# Patient Record
Sex: Male | Born: 1940 | Race: White | Hispanic: No | Marital: Married | Smoking: Former smoker
Health system: Southern US, Community
[De-identification: ages and names within clinical notes are randomized; demographics above are authoritative.]

## PROBLEM LIST (undated history)

## (undated) DIAGNOSIS — F419 Anxiety disorder, unspecified: Secondary | ICD-10-CM

## (undated) DIAGNOSIS — I10 Essential (primary) hypertension: Secondary | ICD-10-CM

## (undated) DIAGNOSIS — M1990 Unspecified osteoarthritis, unspecified site: Secondary | ICD-10-CM

## (undated) DIAGNOSIS — G4730 Sleep apnea, unspecified: Secondary | ICD-10-CM

## (undated) DIAGNOSIS — Z974 Presence of external hearing-aid: Secondary | ICD-10-CM

## (undated) DIAGNOSIS — K219 Gastro-esophageal reflux disease without esophagitis: Secondary | ICD-10-CM

## (undated) DIAGNOSIS — E785 Hyperlipidemia, unspecified: Secondary | ICD-10-CM

## (undated) DIAGNOSIS — Z87442 Personal history of urinary calculi: Secondary | ICD-10-CM

## (undated) HISTORY — PX: TONSILLECTOMY: SUR1361

## (undated) HISTORY — PX: EYE SURGERY: SHX253

## (undated) HISTORY — PX: COLONOSCOPY: SHX174

---

## 2007-04-13 HISTORY — PX: JOINT REPLACEMENT: SHX530

## 2017-10-11 ENCOUNTER — Encounter: Payer: Self-pay | Admitting: *Deleted

## 2017-10-14 ENCOUNTER — Ambulatory Visit
Admission: RE | Admit: 2017-10-14 | Discharge: 2017-10-14 | Disposition: A | Discharge status: Home / Self Care | Payer: Medicare Other | Source: Ambulatory Visit | Attending: Unknown Physician Specialty | Admitting: Unknown Physician Specialty

## 2017-10-14 ENCOUNTER — Encounter: Payer: Self-pay | Admitting: *Deleted

## 2017-10-14 ENCOUNTER — Ambulatory Visit: Payer: Medicare Other | Admitting: Anesthesiology

## 2017-10-14 ENCOUNTER — Encounter
Admission: RE | Disposition: A | Discharge status: Home / Self Care | Payer: Self-pay | Source: Ambulatory Visit | Attending: Unknown Physician Specialty

## 2017-10-14 DIAGNOSIS — D122 Benign neoplasm of ascending colon: Secondary | ICD-10-CM | POA: Insufficient documentation

## 2017-10-14 DIAGNOSIS — F419 Anxiety disorder, unspecified: Secondary | ICD-10-CM | POA: Insufficient documentation

## 2017-10-14 DIAGNOSIS — K640 First degree hemorrhoids: Secondary | ICD-10-CM | POA: Diagnosis not present

## 2017-10-14 DIAGNOSIS — Z1211 Encounter for screening for malignant neoplasm of colon: Secondary | ICD-10-CM | POA: Diagnosis present

## 2017-10-14 DIAGNOSIS — E785 Hyperlipidemia, unspecified: Secondary | ICD-10-CM | POA: Insufficient documentation

## 2017-10-14 DIAGNOSIS — I10 Essential (primary) hypertension: Secondary | ICD-10-CM | POA: Insufficient documentation

## 2017-10-14 DIAGNOSIS — G4730 Sleep apnea, unspecified: Secondary | ICD-10-CM | POA: Insufficient documentation

## 2017-10-14 DIAGNOSIS — D123 Benign neoplasm of transverse colon: Secondary | ICD-10-CM | POA: Insufficient documentation

## 2017-10-14 DIAGNOSIS — Z87442 Personal history of urinary calculi: Secondary | ICD-10-CM | POA: Diagnosis not present

## 2017-10-14 DIAGNOSIS — Z87891 Personal history of nicotine dependence: Secondary | ICD-10-CM | POA: Insufficient documentation

## 2017-10-14 DIAGNOSIS — Z7982 Long term (current) use of aspirin: Secondary | ICD-10-CM | POA: Insufficient documentation

## 2017-10-14 DIAGNOSIS — Z79899 Other long term (current) drug therapy: Secondary | ICD-10-CM | POA: Insufficient documentation

## 2017-10-14 DIAGNOSIS — K635 Polyp of colon: Secondary | ICD-10-CM | POA: Diagnosis not present

## 2017-10-14 HISTORY — DX: Hyperlipidemia, unspecified: E78.5

## 2017-10-14 HISTORY — PX: COLONOSCOPY WITH PROPOFOL: SHX5780

## 2017-10-14 HISTORY — DX: Anxiety disorder, unspecified: F41.9

## 2017-10-14 HISTORY — DX: Personal history of urinary calculi: Z87.442

## 2017-10-14 HISTORY — DX: Sleep apnea, unspecified: G47.30

## 2017-10-14 HISTORY — DX: Essential (primary) hypertension: I10

## 2017-10-14 SURGERY — COLONOSCOPY WITH PROPOFOL
Anesthesia: General

## 2017-10-14 MED ORDER — MIDAZOLAM HCL 2 MG/2ML IJ SOLN
INTRAMUSCULAR | Status: AC
  Filled 2017-10-14: qty 2

## 2017-10-14 MED ORDER — MIDAZOLAM HCL 5 MG/5ML IJ SOLN
INTRAMUSCULAR | Status: DC | PRN
  Administered 2017-10-14: 1 mg via INTRAVENOUS

## 2017-10-14 MED ORDER — SODIUM CHLORIDE 0.9 % IV SOLN: INTRAVENOUS | Status: DC

## 2017-10-14 MED ORDER — PROPOFOL 10 MG/ML IV BOLUS
INTRAVENOUS | Status: DC | PRN
  Administered 2017-10-14: 30 mg via INTRAVENOUS
  Administered 2017-10-14: 10 mg via INTRAVENOUS

## 2017-10-14 MED ORDER — PIPERACILLIN-TAZOBACTAM 3.375 G IVPB
INTRAVENOUS | Status: AC
  Filled 2017-10-14: qty 50

## 2017-10-14 MED ORDER — SODIUM CHLORIDE 0.9 % IV SOLN
INTRAVENOUS | Status: DC
  Administered 2017-10-14: 1000 mL via INTRAVENOUS

## 2017-10-14 MED ORDER — PROPOFOL 500 MG/50ML IV EMUL
INTRAVENOUS | Status: DC | PRN
  Administered 2017-10-14: 50 ug/kg/min via INTRAVENOUS

## 2017-10-14 MED ORDER — PIPERACILLIN-TAZOBACTAM 3.375 G IVPB 30 MIN
3.3750 g | Freq: Once | INTRAVENOUS | Status: AC
  Administered 2017-10-14: 3.375 g via INTRAVENOUS
  Filled 2017-10-14: qty 50

## 2017-10-14 MED ORDER — LIDOCAINE HCL (PF) 2 % IJ SOLN
INTRAMUSCULAR | Status: AC
  Filled 2017-10-14: qty 10

## 2017-10-14 MED ORDER — LIDOCAINE HCL (PF) 2 % IJ SOLN
INTRAMUSCULAR | Status: DC | PRN
  Administered 2017-10-14: 80 mg

## 2017-10-14 MED ORDER — FENTANYL CITRATE (PF) 100 MCG/2ML IJ SOLN
INTRAMUSCULAR | Status: DC | PRN
  Administered 2017-10-14 (×2): 25 ug via INTRAVENOUS

## 2017-10-14 MED ORDER — FENTANYL CITRATE (PF) 100 MCG/2ML IJ SOLN
INTRAMUSCULAR | Status: AC
  Filled 2017-10-14: qty 2

## 2017-10-14 NOTE — Anesthesia Post-op Follow-up Note (Signed)
Anesthesia QCDR form completed.        

## 2017-10-14 NOTE — Anesthesia Preprocedure Evaluation (Signed)
Anesthesia Evaluation  Patient identified by MRN, date of birth, ID band Patient awake    Reviewed: Allergy & Precautions, H&P , NPO status , Patient's Chart, lab work & pertinent test results, reviewed documented beta blocker date and time   Airway Mallampati: II   Neck ROM: full    Dental  (+) Poor Dentition   Pulmonary neg pulmonary ROS, sleep apnea and Continuous Positive Airway Pressure Ventilation , former smoker,    Pulmonary exam normal        Cardiovascular Exercise Tolerance: Good hypertension, On Medications negative cardio ROS Normal cardiovascular exam Rhythm:regular Rate:Normal     Neuro/Psych Anxiety negative neurological ROS  negative psych ROS   GI/Hepatic negative GI ROS, Neg liver ROS,   Endo/Other  negative endocrine ROS  Renal/GU negative Renal ROS  negative genitourinary   Musculoskeletal   Abdominal   Peds  Hematology negative hematology ROS (+)   Anesthesia Other Findings Past Medical History: No date: Anxiety No date: History of kidney stones No date: Hyperlipidemia No date: Hypertension No date: Sleep apnea Past Surgical History: No date: COLONOSCOPY 04/2007: JOINT REPLACEMENT; Right     Comment:  KNEE No date: TONSILLECTOMY BMI    Body Mass Index:  29.42 kg/m     Reproductive/Obstetrics negative OB ROS                             Anesthesia Physical Anesthesia Plan  ASA: III  Anesthesia Plan: General   Post-op Pain Management:    Induction:   PONV Risk Score and Plan:   Airway Management Planned:   Additional Equipment:   Intra-op Plan:   Post-operative Plan:   Informed Consent: I have reviewed the patients History and Physical, chart, labs and discussed the procedure including the risks, benefits and alternatives for the proposed anesthesia with the patient or authorized representative who has indicated his/her understanding and  acceptance.   Dental Advisory Given  Plan Discussed with: CRNA  Anesthesia Plan Comments:         Anesthesia Quick Evaluation

## 2017-10-14 NOTE — Transfer of Care (Signed)
Immediate Anesthesia Transfer of Care Note  Patient: Billy Berg  Procedure(s) Performed: COLONOSCOPY WITH PROPOFOL (N/A )  Patient Location: PACU  Anesthesia Type:General  Level of Consciousness: sedated  Airway & Oxygen Therapy: Patient Spontanous Breathing and Patient connected to nasal cannula oxygen  Post-op Assessment: Report given to RN and Post -op Vital signs reviewed and stable  Post vital signs: Reviewed and stable  Last Vitals:  Vitals Value Taken Time  BP    Temp    Pulse    Resp    SpO2      Last Pain:  Vitals:   10/14/17 1049  TempSrc: Tympanic  PainSc: 0-No pain         Complications: No apparent anesthesia complications

## 2017-10-14 NOTE — Op Note (Signed)
[facility] Patient Name: Billy Berg Procedure Date: 10/14/2017 11:38 AM MRN: [id number] Account #: [id number] Date of Birth: [birth year] Admit Type: Outpatient Age: 77 Room: [facility] ENDO ROOM 3 Gender: Male Note Status: Finalized Procedure:            Colonoscopy Indications:          Screening for colorectal malignant neoplasm Providers:            Manya Silvas, MD Referring MD:         Ramonita Lab, MD (Referring MD) Medicines:            Propofol per Anesthesia Complications:        No immediate complications. Procedure:            Pre-Anesthesia Assessment:                       - After reviewing the risks and benefits, the patient                        was deemed in satisfactory condition to undergo the                        procedure.                       After obtaining informed consent, the colonoscope was                        passed under direct vision. Throughout the procedure,                        the patient's blood pressure, pulse, and oxygen                        saturations were monitored continuously. The                        Colonoscope was introduced through the anus and                        advanced to the the cecum, identified by appendiceal                        orifice and ileocecal valve. The colonoscopy was                        performed without difficulty. The patient tolerated the                        procedure well. The quality of the bowel preparation                        was excellent. Findings:      A small polyp was found in the ascending colon. The polyp was sessile.       The polyp was removed with a hot snare. Resection and retrieval were       complete. To prevent bleeding after the polypectomy, one hemostatic clip       was successfully placed. There was no bleeding during, or at the end, of       the procedure.  A diminutive polyp was found in the ascending colon. The polyp was   sessile. The polyp was removed with a jumbo cold forceps. Resection and       retrieval were complete.      A diminutive polyp was found in the splenic flexure. The polyp was       sessile. The polyp was removed with a cold snare. Resection and       retrieval were complete.      Two sessile polyps were found in the sigmoid colon. The polyps were       diminutive in size. These polyps were removed with a jumbo cold forceps.       Resection and retrieval were complete.      External hemorrhoids were found during endoscopy. The hemorrhoids were       small and Grade I (internal hemorrhoids that do not prolapse). Impression:           - One small polyp in the ascending colon, removed with                        a hot snare. Resected and retrieved. Clip was placed.                       - One diminutive polyp in the ascending colon, removed                        with a jumbo cold forceps. Resected and retrieved.                       - One diminutive polyp at the splenic flexure, removed                        with a cold snare. Resected and retrieved.                       - Two diminutive polyps in the sigmoid colon, removed                        with a jumbo cold forceps. Resected and retrieved.                       - External hemorrhoids. Recommendation:       - Await pathology results. Manya Silvas, MD 10/14/2017 12:17:12 PM This report has been signed electronically. Number of Addenda: 0 Note Initiated On: 10/14/2017 11:38 AM Scope Withdrawal Time: 0 hours 14 minutes 58 seconds  Total Procedure Duration: 0 hours 27 minutes 44 seconds       [facility]

## 2017-10-14 NOTE — H&P (Signed)
Primary Care Physician:  Adin Hector, MD Primary Gastroenterologist:  Dr. Vira Agar  Pre-Procedure History & Physical: HPI:  Billy Berg is a 77 y.o. male is here for an colonoscopy. Done for FH colon cancer in his mother.   Past Medical History:  Diagnosis Date  . Anxiety   . History of kidney stones   . Hyperlipidemia   . Hypertension   . Sleep apnea     Past Surgical History:  Procedure Laterality Date  . COLONOSCOPY    . 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 TONSILLECTOMY      Prior to Admission medications   Medication Sig Start Date End Date Taking? Authorizing Provider  aspirin EC 81 MG tablet Take 81 mg by mouth daily.   Yes [provider]  atorvastatin (LIPITOR) 20 MG tablet Take 20 mg by mouth daily.   Yes [provider]  azelastine (ASTELIN) 0.1 % nasal spray Place 1 spray into both nostrils 2 (two) times daily. Use in each nostril as directed   Yes [provider]  Bacillus Coagulans-Inulin (ALIGN PREBIOTIC-PROBIOTIC PO) Take by mouth daily.   Yes [provider]  losartan-hydrochlorothiazide (HYZAAR) 100-12.5 MG tablet Take 1 tablet by mouth daily.   Yes [provider]  Multiple Vitamin (MULTIVITAMIN) tablet Take 1 tablet by mouth daily.   Yes [provider]  omega-3 acid ethyl esters (LOVAZA) 1 g capsule Take 1 g by mouth 2 (two) times daily.   Yes [provider]  omeprazole (PRILOSEC) 20 MG capsule Take 20 mg by mouth daily.   Yes [provider]  sertraline (ZOLOFT) 50 MG tablet Take 25 mg by mouth daily.   Yes [provider]    Allergies as of 08/12/2017  . (Not on File)    History reviewed. No pertinent family history.  Social History   Socioeconomic History  . Marital status: Married    Spouse name: Not on file  . Number of children: Not on file  . Years of education: Not on file  . Highest education level: Not on file  Occupational History  . Not on  file  Social Needs  . Financial resource strain: Not on file  . Food insecurity:    Worry: Not on file    Inability: Not on file  . Transportation needs:    Medical: Not on file    Non-medical: Not on file  Tobacco Use  . Smoking status: Former Smoker    Packs/day: 1.00    Years: 10.00    Pack years: 10.00    Types: Cigarettes    Last attempt to quit: 01/10/1986    Years since quitting: 31.7  . Smokeless tobacco: Never Used  Substance and Sexual Activity  . Alcohol use: Yes  . Drug use: Never  . Sexual activity: Not on file  Lifestyle  . Physical activity:    Days per week: Not on file    Minutes per session: Not on file  . Stress: Not on file  Relationships  . Social connections:    Talks on phone: Not on file    Gets together: Not on file    Attends religious service: Not on file    Active member of club or organization: Not on file    Attends meetings of clubs or organizations: Not on file    Relationship status: Not on file  . Intimate partner violence:    Fear of current or ex partner: Not  on file    Emotionally abused: Not on file    Physically abused: Not on file    Forced sexual activity: Not on file  Other Topics Concern  . Not on file  Social History Narrative  . Not on file    Review of Systems: See HPI, otherwise negative ROS  Physical Exam: BP (!) 154/96   Pulse 86   Temp (!) 96.3 F (35.7 C) (Tympanic)   Resp 20   Ht 5' 10.5" (1.791 m)   Wt 94.3 kg (208 lb)   SpO2 95%   BMI 29.42 kg/m  General:   Alert,  pleasant and cooperative in NAD Head:  Normocephalic and atraumatic. Neck:  Supple; no masses or thyromegaly. Lungs:  Clear throughout to auscultation.    Heart:  Regular rate and rhythm. Abdomen:  Soft, nontender and nondistended. Normal bowel sounds, without guarding, and without rebound.   Neurologic:  Alert and  oriented x4;  grossly normal neurologically.  Impression/Plan: Billy Berg is here for an colonoscopy to be performed for  family history of colon cancer in his mother.  Risks, benefits, limitations, and alternatives regarding  colonoscopy have been reviewed with the patient.  Questions have been answered.  All parties agreeable.   Gaylyn Cheers, MD  10/14/2017, 11:36 AM

## 2017-10-15 ENCOUNTER — Encounter: Payer: Self-pay | Admitting: Unknown Physician Specialty

## 2017-10-15 LAB — SURGICAL PATHOLOGY

## 2017-10-15 NOTE — Anesthesia Postprocedure Evaluation (Signed)
Anesthesia Post Note  Patient: Billy Berg  Procedure(s) Performed: COLONOSCOPY WITH PROPOFOL (N/A )  Patient location during evaluation: PACU Anesthesia Type: General Level of consciousness: awake and alert Pain management: pain level controlled Vital Signs Assessment: post-procedure vital signs reviewed and stable Respiratory status: spontaneous breathing, nonlabored ventilation, respiratory function stable and patient connected to nasal cannula oxygen Cardiovascular status: blood pressure returned to baseline and stable Postop Assessment: no apparent nausea or vomiting Anesthetic complications: no     Last Vitals:  Vitals:   10/14/17 1236 10/14/17 1246  BP: 112/72 109/60  Pulse: 62 (!) 54  Resp: 17 14  Temp:    SpO2: 97% 98%    Last Pain:  Vitals:   10/14/17 1226  TempSrc:   PainSc: 0-No pain                 Molli Barrows

## 2019-04-07 ENCOUNTER — Ambulatory Visit: Payer: Medicare Other

## 2019-04-16 ENCOUNTER — Ambulatory Visit: Payer: Medicare Other | Attending: Internal Medicine

## 2019-04-16 DIAGNOSIS — Z23 Encounter for immunization: Secondary | ICD-10-CM | POA: Insufficient documentation

## 2019-04-16 NOTE — Progress Notes (Signed)
   Covid-19 Vaccination Clinic  Name:  Billy Berg    MRN: [id number] DOB: 02/26/1941  04/16/2019  [name] was observed post Covid-19 immunization for 15 minutes without incidence. He was provided with Vaccine Information Sheet and instruction to access the V-Safe system.   [name] was instructed to call 911 with any severe reactions post vaccine: [name] Kitchen Difficulty breathing  . Swelling of your face and throat  . A fast heartbeat  . A bad rash all over your body  . Dizziness and weakness    Immunizations Administered    Name Date Dose VIS Date Route   Pfizer COVID-19 Vaccine 04/16/2019  5:38 PM 0.3 mL 02/20/2019 Intramuscular   Manufacturer: [place]   Lot: [id number]   [place]: [id number]    .

## 2019-04-24 ENCOUNTER — Ambulatory Visit: Payer: Medicare Other

## 2019-05-12 ENCOUNTER — Ambulatory Visit: Payer: Medicare Other | Attending: Internal Medicine

## 2019-05-12 DIAGNOSIS — Z23 Encounter for immunization: Secondary | ICD-10-CM

## 2019-05-12 NOTE — Progress Notes (Signed)
   Covid-19 Vaccination Clinic  Name:  Billy Berg    MRN: [id number] DOB: [birth year]  05/12/2019  Billy Berg was observed post Covid-19 immunization for 15 minutes without incident. He was provided with Vaccine Information Sheet and instruction to access the V-Safe system.   Billy Berg was instructed to call 911 with any severe reactions post vaccine: [name] Kitchen Difficulty breathing  . Swelling of face and throat  . A fast heartbeat  . A bad rash all over body  . Dizziness and weakness   Immunizations Administered    Name Date Dose VIS Date Route   Pfizer COVID-19 Vaccine 05/12/2019  8:26 AM 0.3 mL 02/20/2019 Intramuscular   Manufacturer: [place]   Lot: [id number]   [place]: [id number]

## 2020-06-16 ENCOUNTER — Other Ambulatory Visit: Payer: Self-pay | Admitting: Internal Medicine

## 2020-06-16 ENCOUNTER — Other Ambulatory Visit: Payer: Self-pay | Admitting: Family Medicine

## 2020-06-16 DIAGNOSIS — R413 Other amnesia: Secondary | ICD-10-CM

## 2020-06-20 ENCOUNTER — Ambulatory Visit
Admission: RE | Admit: 2020-06-20 | Discharge: 2020-06-20 | Disposition: A | Discharge status: Home / Self Care | Payer: Medicare Other | Source: Ambulatory Visit | Attending: Internal Medicine | Admitting: Internal Medicine

## 2020-06-20 ENCOUNTER — Other Ambulatory Visit: Payer: Self-pay

## 2020-06-20 DIAGNOSIS — R413 Other amnesia: Secondary | ICD-10-CM

## 2020-10-26 ENCOUNTER — Other Ambulatory Visit (HOSPITAL_COMMUNITY): Payer: Self-pay | Admitting: Neurology

## 2020-10-26 ENCOUNTER — Other Ambulatory Visit: Payer: Self-pay | Admitting: Neurology

## 2020-10-26 DIAGNOSIS — R413 Other amnesia: Secondary | ICD-10-CM

## 2020-11-08 ENCOUNTER — Other Ambulatory Visit: Payer: Self-pay

## 2020-11-08 ENCOUNTER — Ambulatory Visit (HOSPITAL_COMMUNITY)
Admission: RE | Admit: 2020-11-08 | Discharge: 2020-11-08 | Disposition: A | Discharge status: Home / Self Care | Payer: Medicare Other | Source: Ambulatory Visit | Attending: Neurology | Admitting: Neurology

## 2020-11-08 DIAGNOSIS — R413 Other amnesia: Secondary | ICD-10-CM | POA: Diagnosis not present

## 2021-07-11 ENCOUNTER — Encounter: Payer: Self-pay | Admitting: Ophthalmology

## 2021-07-13 NOTE — Discharge Instructions (Signed)

## 2021-07-18 ENCOUNTER — Encounter
Admission: RE | Disposition: A | Discharge status: Home / Self Care | Payer: Self-pay | Source: Home / Self Care | Attending: Ophthalmology

## 2021-07-18 ENCOUNTER — Encounter: Payer: Self-pay | Admitting: Ophthalmology

## 2021-07-18 ENCOUNTER — Ambulatory Visit: Payer: Medicare Other | Admitting: Anesthesiology

## 2021-07-18 ENCOUNTER — Ambulatory Visit
Admission: RE | Admit: 2021-07-18 | Discharge: 2021-07-18 | Disposition: A | Discharge status: Home / Self Care | Payer: Medicare Other | Attending: Ophthalmology | Admitting: Ophthalmology

## 2021-07-18 ENCOUNTER — Other Ambulatory Visit: Payer: Self-pay

## 2021-07-18 DIAGNOSIS — H2511 Age-related nuclear cataract, right eye: Secondary | ICD-10-CM | POA: Diagnosis present

## 2021-07-18 DIAGNOSIS — Z87891 Personal history of nicotine dependence: Secondary | ICD-10-CM | POA: Diagnosis not present

## 2021-07-18 DIAGNOSIS — G4730 Sleep apnea, unspecified: Secondary | ICD-10-CM | POA: Diagnosis not present

## 2021-07-18 DIAGNOSIS — I10 Essential (primary) hypertension: Secondary | ICD-10-CM | POA: Insufficient documentation

## 2021-07-18 HISTORY — DX: Gastro-esophageal reflux disease without esophagitis: K21.9

## 2021-07-18 HISTORY — DX: Presence of external hearing-aid: Z97.4

## 2021-07-18 HISTORY — PX: CATARACT EXTRACTION W/PHACO: SHX586

## 2021-07-18 HISTORY — DX: Unspecified osteoarthritis, unspecified site: M19.90

## 2021-07-18 SURGERY — PHACOEMULSIFICATION, CATARACT, WITH IOL INSERTION
Anesthesia: Monitor Anesthesia Care | Site: Eye | Laterality: Right

## 2021-07-18 MED ORDER — LACTATED RINGERS IV SOLN: INTRAVENOUS | Status: DC

## 2021-07-18 MED ORDER — FENTANYL CITRATE (PF) 100 MCG/2ML IJ SOLN
INTRAMUSCULAR | Status: DC | PRN
  Administered 2021-07-18: 50 ug via INTRAVENOUS

## 2021-07-18 MED ORDER — SIGHTPATH DOSE#1 BSS IO SOLN
INTRAOCULAR | Status: DC | PRN
  Administered 2021-07-18: 15 mL

## 2021-07-18 MED ORDER — ACETAMINOPHEN 325 MG PO TABS
650.0000 mg | ORAL_TABLET | Freq: Once | ORAL | Status: AC
Start: 2021-07-18 — End: 2021-07-18
  Administered 2021-07-18: 650 mg via ORAL

## 2021-07-18 MED ORDER — PHENYLEPHRINE HCL 10 % OP SOLN
1.0000 [drp] | OPHTHALMIC | Status: DC | PRN
  Administered 2021-07-18 (×3): 1 [drp] via OPHTHALMIC

## 2021-07-18 MED ORDER — SIGHTPATH DOSE#1 BSS IO SOLN
INTRAOCULAR | Status: DC | PRN
  Administered 2021-07-18: 1 mL via INTRAMUSCULAR

## 2021-07-18 MED ORDER — CYCLOPENTOLATE HCL 2 % OP SOLN
1.0000 [drp] | OPHTHALMIC | Status: DC | PRN
  Administered 2021-07-18 (×3): 1 [drp] via OPHTHALMIC

## 2021-07-18 MED ORDER — TETRACAINE HCL 0.5 % OP SOLN
1.0000 [drp] | OPHTHALMIC | Status: DC | PRN
  Administered 2021-07-18 (×3): 1 [drp] via OPHTHALMIC

## 2021-07-18 MED ORDER — SIGHTPATH DOSE#1 BSS IO SOLN
INTRAOCULAR | Status: DC | PRN
  Administered 2021-07-18: 63 mL via OPHTHALMIC

## 2021-07-18 MED ORDER — MOXIFLOXACIN HCL 0.5 % OP SOLN
OPHTHALMIC | Status: DC | PRN
  Administered 2021-07-18: 0.2 mL via OPHTHALMIC

## 2021-07-18 MED ORDER — MIDAZOLAM HCL 2 MG/2ML IJ SOLN
INTRAMUSCULAR | Status: DC | PRN
  Administered 2021-07-18: 1 mg via INTRAVENOUS

## 2021-07-18 MED ORDER — BRIMONIDINE TARTRATE-TIMOLOL 0.2-0.5 % OP SOLN
OPHTHALMIC | Status: DC | PRN
  Administered 2021-07-18: 1 [drp] via OPHTHALMIC

## 2021-07-18 MED ORDER — SIGHTPATH DOSE#1 NA CHONDROIT SULF-NA HYALURON 40-17 MG/ML IO SOLN
INTRAOCULAR | Status: DC | PRN
  Administered 2021-07-18: 1 mL via INTRAOCULAR

## 2021-07-18 SURGICAL SUPPLY — 10 items
CATARACT SUITE SIGHTPATH (MISCELLANEOUS) ×2 IMPLANT
FEE CATARACT SUITE SIGHTPATH (MISCELLANEOUS) ×1 IMPLANT
GLOVE SURG ENC TEXT LTX SZ8 (GLOVE) ×2 IMPLANT
GLOVE SURG TRIUMPH 8.0 PF LTX (GLOVE) ×2 IMPLANT
LENS IOL TECNIS EYHANCE 16.5 (Intraocular Lens) ×1 IMPLANT
NDL FILTER BLUNT 18X1 1/2 (NEEDLE) ×1 IMPLANT
NEEDLE FILTER BLUNT 18X 1/2SAF (NEEDLE) ×1
NEEDLE FILTER BLUNT 18X1 1/2 (NEEDLE) ×1 IMPLANT
SYR 3ML LL SCALE MARK (SYRINGE) ×2 IMPLANT
WATER STERILE IRR 250ML POUR (IV SOLUTION) ×2 IMPLANT

## 2021-07-18 NOTE — Anesthesia Preprocedure Evaluation (Signed)
Anesthesia Evaluation  ?Patient identified by MRN, date of birth, ID band ?Patient awake ? ? ? ?Reviewed: ?Allergy & Precautions, H&P , NPO status , Patient's Chart, lab work & pertinent test results ? ?History of Anesthesia Complications ?Negative for: history of anesthetic complications ? ?Airway ?Mallampati: II ? ?TM Distance: >3 FB ?Neck ROM: full ? ? ? Dental ?no notable dental hx. ? ?  ?Pulmonary ?sleep apnea and Continuous Positive Airway Pressure Ventilation , former smoker,  ?  ?Pulmonary exam normal ? ? ? ? ? ? ? Cardiovascular ?hypertension, On Medications ?Normal cardiovascular exam ?Rhythm:regular Rate:Normal ? ? ?  ?Neuro/Psych ?negative neurological ROS ?   ? GI/Hepatic ?Neg liver ROS, Medicated,  ?Endo/Other  ?negative endocrine ROS ? Renal/GU ?negative Renal ROS  ?negative genitourinary ?  ?Musculoskeletal ? ? Abdominal ?  ?Peds ? Hematology ?negative hematology ROS ?(+)   ?Anesthesia Other Findings ? ? Reproductive/Obstetrics ? ?  ? ? ? ? ? ? ? ? ? ? ? ? ? ?  ?  ? ? ? ? ? ? ? ? ?Anesthesia Physical ?Anesthesia Plan ? ?ASA: 2 ? ?Anesthesia Plan: MAC  ? ?Post-op Pain Management:   ? ?Induction:  ? ?PONV Risk Score and Plan: 1 and TIVA and Midazolam ? ?Airway Management Planned:  ? ?Additional Equipment:  ? ?Intra-op Plan:  ? ?Post-operative Plan:  ? ?Informed Consent: I have reviewed the patients History and Physical, chart, labs and discussed the procedure including the risks, benefits and alternatives for the proposed anesthesia with the patient or authorized representative who has indicated his/her understanding and acceptance.  ? ? ? ? ? ?Plan Discussed with:  ? ?Anesthesia Plan Comments:   ? ? ? ? ? ? ?Anesthesia Quick Evaluation ? ?

## 2021-07-18 NOTE — Transfer of Care (Signed)
Immediate Anesthesia Transfer of Care Note ? ?Patient: Billy Berg ? ?Procedure(s) Performed: CATARACT EXTRACTION PHACO AND INTRAOCULAR LENS PLACEMENT (IOC) RIGHT (Right: Eye) ? ?Patient Location: PACU ? ?Anesthesia Type: MAC ? ?Level of Consciousness: awake, alert  and patient cooperative ? ?Airway and Oxygen Therapy: Patient Spontanous Breathing and Patient connected to supplemental oxygen ? ?Post-op Assessment: Post-op Vital signs reviewed, Patient's Cardiovascular Status Stable, Respiratory Function Stable, Patent Airway and No signs of Nausea or vomiting ? ?Post-op Vital Signs: Reviewed and stable ? ?Complications: No notable events documented. ? ?

## 2021-07-18 NOTE — Anesthesia Postprocedure Evaluation (Signed)
Anesthesia Post Note ? ?Patient: Billy Berg ? ?Procedure(s) Performed: CATARACT EXTRACTION PHACO AND INTRAOCULAR LENS PLACEMENT (IOC) RIGHT (Right: Eye) ? ? ?  ?Patient location during evaluation: PACU ?Anesthesia Type: MAC ?Level of consciousness: awake and alert ?Pain management: pain level controlled ?Vital Signs Assessment: post-procedure vital signs reviewed and stable ?Respiratory status: spontaneous breathing ?Cardiovascular status: stable ?Anesthetic complications: no ? ? ?No notable events documented. ? ?Virl Axe,  Laelah Siravo D ? ? ? ? ? ?

## 2021-07-18 NOTE — H&P (Signed)
?[facility]  ? ?Primary Care Physician:  Adin Hector, MD ?Ophthalmologist: Dr. George Ina ? ?Pre-Procedure History & Physical: ?HPI:  Billy Berg is a 81 y.o. male here for cataract surgery. ?  ?Past Medical History:  ?Diagnosis Date  ? Anxiety   ? Arthritis   ? GERD (gastroesophageal reflux disease)   ? History of kidney stones   ? Hyperlipidemia   ? Hypertension   ? Sleep apnea   ? Wears hearing aid in both ears   ? ? ?Past Surgical History:  ?Procedure Laterality Date  ? COLONOSCOPY    ? COLONOSCOPY WITH PROPOFOL N/A 10/14/2017  ? Procedure: COLONOSCOPY WITH PROPOFOL;  Surgeon: Manya Silvas, MD;  Location: [facility] ENDOSCOPY;  Service: Endoscopy;  Laterality: N/A;  ? 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 KNEE  ? TONSILLECTOMY    ? ? ?Prior to Admission medications   ?Medication Sig Start Date End Date Taking? Authorizing Provider  ?amLODipine (NORVASC) 10 MG tablet Take 10 mg by mouth daily.   Yes [provider]  ?amoxicillin (AMOXIL) 500 MG tablet Take 2,000 mg by mouth. 1 hour prior to dental procedures   Yes [provider]  ?aspirin EC 81 MG tablet Take 81 mg by mouth daily.   Yes [provider]  ?atorvastatin (LIPITOR) 20 MG tablet Take 20 mg by mouth daily.   Yes [provider]  ?azelastine (ASTELIN) 0.1 % nasal spray Place 1 spray into both nostrils 2 (two) times daily. Use in each nostril as directed   Yes [provider]  ?Bacillus Coagulans-Inulin (ALIGN PREBIOTIC-PROBIOTIC PO) Take by mouth daily.   Yes [provider]  ?calcium carbonate (TUMS - DOSED IN MG ELEMENTAL CALCIUM) 500 MG chewable tablet Chew 1 tablet by mouth daily.   Yes [provider]  ?carboxymethylcellulose (REFRESH PLUS) 0.5 % SOLN 1 drop as needed.   Yes [provider]  ?donepezil (ARICEPT) 5 MG tablet Take 5 mg by mouth at bedtime.   Yes [provider]  ?fexofenadine (ALLEGRA) 180 MG tablet Take 180 mg by mouth daily.   Yes [provider]  ?losartan-hydrochlorothiazide (HYZAAR) 100-12.5 MG tablet Take 1 tablet by mouth daily.   Yes [provider]  ?Methylcellulose, Laxative, (CITRUCEL PO) Take by mouth daily.   Yes [provider]  ?Multiple Vitamin (MULTIVITAMIN) tablet Take 1 tablet by mouth daily. Optimal-M, Nutrifii multivit,  1 at breakfast, 1 at dinner ?Optimal-V Nutrifii multivit, 2 at breakfast, 1 at dinner   Yes [provider]  ?omega-3 acid ethyl esters (LOVAZA) 1 g capsule Take 1 g by mouth 2 (two) times daily.   Yes [provider]  ?omeprazole (PRILOSEC) 20 MG capsule Take 20 mg by mouth daily.   Yes [provider]  ?sertraline (ZOLOFT) 50 MG tablet Take 25 mg by mouth daily.   Yes [provider]  ?Turmeric 500 MG TABS Take 2,000 mg by mouth daily.   Yes [provider]  ? ? ?Allergies as of 06/09/2021 - Review Complete 10/14/2017  ?Allergen Reaction Noted  ? Hydrocodone Other (See Comments) 10/11/2017  ? Oxycodone Other (See Comments) 10/11/2017  ? ? ?History reviewed. No pertinent family history. ? ?Social History  ? ?Socioeconomic History  ? Marital status: Married  ?  Spouse name: Not on file  ? Number of children: Not on file  ? Years of education: Not on file  ? Highest education level: Not on file  ?Occupational History  ? Not  on file  ?Tobacco Use  ? Smoking status: Former  ?  Packs/day: 1.00  ?  Years: 10.00  ?  Pack years: 10.00  ?  Types: Cigarettes  ?  Quit date: 01/10/1986  ?  Years since quitting: 35.5  ? Smokeless tobacco: Never  ?Vaping Use  ? Vaping Use: Never used  ?Substance and Sexual Activity  ? Alcohol use: Yes  ?  Alcohol/week: 7.0 standard drinks  ?  Types: 7 Glasses of wine per week  ? Drug use: Never  ? Sexual activity: Not on file  ?Other Topics Concern  ? Not on file  ?Social History Narrative  ? Not on file  ? ?Social Determinants of Health  ? ?Financial Resource Strain: Not on file  ?Food Insecurity: Not on file  ?Transportation  Needs: Not on file  ?Physical Activity: Not on file  ?Stress: Not on file  ?Social Connections: Not on file  ?Intimate Partner Violence: Not on file  ? ? ?Review of Systems: ?See HPI, otherwise negative ROS ? ?Physical Exam: ?BP (!) 153/64   Pulse 62   Temp (!) 97.1 ?F (36.2 ?C) (Temporal)   Resp 20   Ht '5\' 10"'$  (1.778 m)   Wt 100.2 kg   SpO2 96%   BMI 31.71 kg/m?  ?General:   Alert, cooperative in NAD ?Head:  Normocephalic and atraumatic. ?Respiratory:  Normal work of breathing. ?Cardiovascular:  RRR ? ?Impression/Plan: ?Billy Berg is here for cataract surgery. ? ?Risks, benefits, limitations, and alternatives regarding cataract surgery have been reviewed with the patient.  Questions have been answered.  All parties agreeable. ? ? ?Birder Robson, MD  07/18/2021, 10:07 AM ? ?

## 2021-07-18 NOTE — Op Note (Signed)
PREOPERATIVE DIAGNOSIS:  Nuclear sclerotic cataract of the right eye. ?  ?POSTOPERATIVE DIAGNOSIS:  H25.11 Cataract ?  ?OPERATIVE PROCEDURE:ORPROCALL@ ?  ?SURGEON:  Birder Robson, MD. ?  ?ANESTHESIA: ? ?Anesthesiologist: Elgie Collard, MD ?CRNA: Silvana Newness, CRNA ? ?1.      Managed anesthesia care. ?2.      0.91m of Shugarcaine was instilled in the eye following the paracentesis. ?  ?COMPLICATIONS:  None. ?  ?TECHNIQUE:   Stop and chop ?  ?DESCRIPTION OF PROCEDURE:  The patient was examined and consented in the preoperative holding area where the aforementioned topical anesthesia was applied to the right eye and then brought back to the Operating Room where the right eye was prepped and draped in the usual sterile ophthalmic fashion and a lid speculum was placed. A paracentesis was created with the side port blade and the anterior chamber was filled with viscoelastic. A near clear corneal incision was performed with the steel keratome. A continuous curvilinear capsulorrhexis was performed with a cystotome followed by the capsulorrhexis forceps. Hydrodissection and hydrodelineation were carried out with BSS on a blunt cannula. The lens was removed in a stop and chop  technique and the remaining cortical material was removed with the irrigation-aspiration handpiece. The capsular bag was inflated with viscoelastic and the Technis [id number]  lens was placed in the capsular bag without complication. The remaining viscoelastic was removed from the eye with the irrigation-aspiration handpiece. The wounds were hydrated. The anterior chamber was flushed with BSS and the eye was inflated to physiologic pressure. 0.14mof Vigamox was placed in the anterior chamber. The wounds were found to be water tight. The eye was dressed with Combigan. The patient was given protective glasses to wear throughout the day and a shield with which to sleep tonight. The patient was also given drops with which to begin a drop regimen today and  will follow-up with me in one day. ?Implant Name Type Inv. Item Serial No. Manufacturer Lot No. LRB No. Used Action  ?LENS IOL TECNIS EYHANCE 16.5 - [id number] Lens LENS IOL TECNIS EYHANCE 16.5 [id number]  Right 1 Implanted  ? ?Procedure(s) with comments: ?CATARACT EXTRACTION PHACO AND INTRAOCULAR LENS PLACEMENT (IOC) RIGHT (Right) - sleep apnea ?8.51 ?00:48.6 ? ?Electronically signed: WiBirder Robson/11/2021 10:32 AM ? ?

## 2021-07-19 ENCOUNTER — Encounter: Payer: Self-pay | Admitting: Ophthalmology

## 2021-07-24 ENCOUNTER — Encounter: Payer: Self-pay | Admitting: Ophthalmology

## 2021-07-31 NOTE — Discharge Instructions (Signed)

## 2021-08-01 ENCOUNTER — Encounter: Payer: Self-pay | Admitting: Ophthalmology

## 2021-08-01 ENCOUNTER — Ambulatory Visit: Payer: Medicare Other | Admitting: Anesthesiology

## 2021-08-01 ENCOUNTER — Encounter
Admission: RE | Disposition: A | Discharge status: Home / Self Care | Payer: Self-pay | Source: Home / Self Care | Attending: Ophthalmology

## 2021-08-01 ENCOUNTER — Ambulatory Visit
Admission: RE | Admit: 2021-08-01 | Discharge: 2021-08-01 | Disposition: A | Discharge status: Home / Self Care | Payer: Medicare Other | Attending: Ophthalmology | Admitting: Ophthalmology

## 2021-08-01 ENCOUNTER — Other Ambulatory Visit: Payer: Self-pay

## 2021-08-01 DIAGNOSIS — G4730 Sleep apnea, unspecified: Secondary | ICD-10-CM | POA: Diagnosis not present

## 2021-08-01 DIAGNOSIS — Z87891 Personal history of nicotine dependence: Secondary | ICD-10-CM | POA: Insufficient documentation

## 2021-08-01 DIAGNOSIS — I10 Essential (primary) hypertension: Secondary | ICD-10-CM | POA: Insufficient documentation

## 2021-08-01 DIAGNOSIS — K219 Gastro-esophageal reflux disease without esophagitis: Secondary | ICD-10-CM | POA: Diagnosis not present

## 2021-08-01 DIAGNOSIS — H2512 Age-related nuclear cataract, left eye: Secondary | ICD-10-CM | POA: Insufficient documentation

## 2021-08-01 DIAGNOSIS — Z79899 Other long term (current) drug therapy: Secondary | ICD-10-CM | POA: Diagnosis not present

## 2021-08-01 HISTORY — PX: CATARACT EXTRACTION W/PHACO: SHX586

## 2021-08-01 SURGERY — PHACOEMULSIFICATION, CATARACT, WITH IOL INSERTION
Anesthesia: Monitor Anesthesia Care | Site: Eye | Laterality: Left

## 2021-08-01 MED ORDER — ARMC OPHTHALMIC DILATING DROPS
1.0000 "application " | OPHTHALMIC | Status: DC | PRN
  Administered 2021-08-01 (×3): 1 via OPHTHALMIC

## 2021-08-01 MED ORDER — SIGHTPATH DOSE#1 BSS IO SOLN
INTRAOCULAR | Status: DC | PRN
  Administered 2021-08-01: 15 mL via INTRAOCULAR

## 2021-08-01 MED ORDER — TETRACAINE HCL 0.5 % OP SOLN
1.0000 [drp] | OPHTHALMIC | Status: DC | PRN
Start: 2021-08-01 — End: 2021-08-01
  Administered 2021-08-01 (×3): 1 [drp] via OPHTHALMIC

## 2021-08-01 MED ORDER — ACETAMINOPHEN 160 MG/5ML PO SOLN: 975.0000 mg | Freq: Once | ORAL | Status: DC | PRN

## 2021-08-01 MED ORDER — MOXIFLOXACIN HCL 0.5 % OP SOLN
OPHTHALMIC | Status: DC | PRN
  Administered 2021-08-01: 0.2 mL via OPHTHALMIC

## 2021-08-01 MED ORDER — MIDAZOLAM HCL 2 MG/2ML IJ SOLN
INTRAMUSCULAR | Status: DC | PRN
  Administered 2021-08-01: 2 mg via INTRAVENOUS

## 2021-08-01 MED ORDER — SIGHTPATH DOSE#1 BSS IO SOLN
INTRAOCULAR | Status: DC | PRN
  Administered 2021-08-01: 2 mL

## 2021-08-01 MED ORDER — SIGHTPATH DOSE#1 BSS IO SOLN
INTRAOCULAR | Status: DC | PRN
  Administered 2021-08-01: 84 mL via OPHTHALMIC

## 2021-08-01 MED ORDER — ACETAMINOPHEN 500 MG PO TABS: 1000.0000 mg | ORAL_TABLET | Freq: Once | ORAL | Status: DC | PRN

## 2021-08-01 MED ORDER — LACTATED RINGERS IV SOLN: INTRAVENOUS | Status: DC

## 2021-08-01 MED ORDER — FENTANYL CITRATE (PF) 100 MCG/2ML IJ SOLN
INTRAMUSCULAR | Status: DC | PRN
Start: 2021-08-01 — End: 2021-08-01
  Administered 2021-08-01 (×2): 50 ug via INTRAVENOUS

## 2021-08-01 MED ORDER — SIGHTPATH DOSE#1 NA CHONDROIT SULF-NA HYALURON 40-17 MG/ML IO SOLN
INTRAOCULAR | Status: DC | PRN
  Administered 2021-08-01: 1 mL via INTRAOCULAR

## 2021-08-01 MED ORDER — ONDANSETRON HCL 4 MG/2ML IJ SOLN: 4.0000 mg | Freq: Once | INTRAMUSCULAR | Status: DC | PRN

## 2021-08-01 MED ORDER — BRIMONIDINE TARTRATE-TIMOLOL 0.2-0.5 % OP SOLN
OPHTHALMIC | Status: DC | PRN
  Administered 2021-08-01: 1 [drp] via OPHTHALMIC

## 2021-08-01 SURGICAL SUPPLY — 12 items
CANNULA ANT/CHMB 27G (MISCELLANEOUS) IMPLANT
CANNULA ANT/CHMB 27GA (MISCELLANEOUS) IMPLANT
CATARACT SUITE SIGHTPATH (MISCELLANEOUS) ×2 IMPLANT
FEE CATARACT SUITE SIGHTPATH (MISCELLANEOUS) ×1 IMPLANT
GLOVE SURG ENC TEXT LTX SZ8 (GLOVE) ×2 IMPLANT
GLOVE SURG TRIUMPH 8.0 PF LTX (GLOVE) ×2 IMPLANT
LENS IOL TECNIS EYHANCE 15.5 (Intraocular Lens) ×1 IMPLANT
NDL FILTER BLUNT 18X1 1/2 (NEEDLE) ×1 IMPLANT
NEEDLE FILTER BLUNT 18X 1/2SAF (NEEDLE) ×1
NEEDLE FILTER BLUNT 18X1 1/2 (NEEDLE) ×1 IMPLANT
SYR 3ML LL SCALE MARK (SYRINGE) ×2 IMPLANT
WATER STERILE IRR 250ML POUR (IV SOLUTION) ×2 IMPLANT

## 2021-08-01 NOTE — Op Note (Signed)
PREOPERATIVE DIAGNOSIS:  Nuclear sclerotic cataract of the left eye.   POSTOPERATIVE DIAGNOSIS:  Nuclear sclerotic cataract of the left eye.   OPERATIVE PROCEDURE:ORPROCALL@   SURGEON:  Birder Robson, MD.   ANESTHESIA:  Anesthesiologist: April Manson, MD CRNA: Jeannene Patella, CRNA  1.      Managed anesthesia care. 2.     0.69m of Shugarcaine was instilled following the paracentesis   COMPLICATIONS:  None.   TECHNIQUE:   Stop and chop   DESCRIPTION OF PROCEDURE:  The patient was examined and consented in the preoperative holding area where the aforementioned topical anesthesia was applied to the left eye and then brought back to the Operating Room where the left eye was prepped and draped in the usual sterile ophthalmic fashion and a lid speculum was placed. A paracentesis was created with the side port blade and the anterior chamber was filled with viscoelastic. A near clear corneal incision was performed with the steel keratome. A continuous curvilinear capsulorrhexis was performed with a cystotome followed by the capsulorrhexis forceps. Hydrodissection and hydrodelineation were carried out with BSS on a blunt cannula. The lens was removed in a stop and chop  technique and the remaining cortical material was removed with the irrigation-aspiration handpiece. The capsular bag was inflated with viscoelastic and the Technis [id number] lens was placed in the capsular bag without complication. The remaining viscoelastic was removed from the eye with the irrigation-aspiration handpiece. The wounds were hydrated. The anterior chamber was flushed with BSS and the eye was inflated to physiologic pressure. 0.156mVigamox was placed in the anterior chamber. The wounds were found to be water tight. The eye was dressed with Combigan. The patient was given protective glasses to wear throughout the day and a shield with which to sleep tonight. The patient was also given drops with which to begin a drop  regimen today and will follow-up with me in one day. Implant Name Type Inv. Item Serial No. Manufacturer Lot No. LRB No. Used Action  LENS IOL TECNIS EYHANCE 15.5 - [id number] Lens LENS IOL TECNIS EYHANCE 15.5 [id number]  Left 1 Implanted    Procedure(s) with comments: CATARACT EXTRACTION PHACO AND INTRAOCULAR LENS PLACEMENT ([facility]) LEFT 8.06 01:02.5 (Left) - sleep apnea  Electronically signed: WiBirder Robson/23/2023 10:37 AM

## 2021-08-01 NOTE — Anesthesia Preprocedure Evaluation (Signed)
Anesthesia Evaluation  Patient identified by MRN, date of birth, ID band Patient awake    Reviewed: Allergy & Precautions, H&P , NPO status , Patient's Chart, lab work & pertinent test results  History of Anesthesia Complications Negative for: history of anesthetic complications  Airway Mallampati: II  TM Distance: >3 FB Neck ROM: full    Dental no notable dental hx.    Pulmonary sleep apnea and Continuous Positive Airway Pressure Ventilation , former smoker,    Pulmonary exam normal        Cardiovascular hypertension, On Medications Normal cardiovascular exam Rhythm:regular Rate:Normal     Neuro/Psych negative neurological ROS     GI/Hepatic Neg liver ROS, GERD  Medicated,  Endo/Other  negative endocrine ROS  Renal/GU negative Renal ROS  negative genitourinary   Musculoskeletal   Abdominal   Peds  Hematology negative hematology ROS (+)   Anesthesia Other Findings   Reproductive/Obstetrics                             Anesthesia Physical  Anesthesia Plan  ASA: 2  Anesthesia Plan: MAC   Post-op Pain Management:    Induction:   PONV Risk Score and Plan: 1 and TIVA and Midazolam  Airway Management Planned:   Additional Equipment:   Intra-op Plan:   Post-operative Plan:   Informed Consent: I have reviewed the patients History and Physical, chart, labs and discussed the procedure including the risks, benefits and alternatives for the proposed anesthesia with the patient or authorized representative who has indicated his/her understanding and acceptance.       Plan Discussed with:   Anesthesia Plan Comments:         Anesthesia Quick Evaluation

## 2021-08-01 NOTE — Anesthesia Procedure Notes (Signed)
Procedure Name: MAC Date/Time: 08/01/2021 10:20 AM Performed by: Jeannene Patella, CRNA Pre-anesthesia Checklist: Patient identified, Emergency Drugs available, Suction available, Timeout performed and Patient being monitored Patient Re-evaluated:Patient Re-evaluated prior to induction Oxygen Delivery Method: Nasal cannula Placement Confirmation: positive ETCO2

## 2021-08-01 NOTE — Transfer of Care (Signed)
Immediate Anesthesia Transfer of Care Note  Patient: Billy Berg  Procedure(s) Performed: CATARACT EXTRACTION PHACO AND INTRAOCULAR LENS PLACEMENT (IOC) LEFT 8.06 01:02.5 (Left: Eye)  Patient Location: PACU  Anesthesia Type: MAC  Level of Consciousness: awake, alert  and patient cooperative  Airway and Oxygen Therapy: Patient Spontanous Breathing and Patient connected to supplemental oxygen  Post-op Assessment: Post-op Vital signs reviewed, Patient's Cardiovascular Status Stable, Respiratory Function Stable, Patent Airway and No signs of Nausea or vomiting  Post-op Vital Signs: Reviewed and stable  Complications: No notable events documented.

## 2021-08-01 NOTE — Anesthesia Postprocedure Evaluation (Signed)
Anesthesia Post Note  Patient: Billy Berg  Procedure(s) Performed: CATARACT EXTRACTION PHACO AND INTRAOCULAR LENS PLACEMENT (IOC) LEFT 8.06 01:02.5 (Left: Eye)     Patient location during evaluation: PACU Anesthesia Type: MAC Level of consciousness: awake and alert Pain management: pain level controlled Vital Signs Assessment: post-procedure vital signs reviewed and stable Respiratory status: spontaneous breathing, nonlabored ventilation and respiratory function stable Cardiovascular status: stable and blood pressure returned to baseline Postop Assessment: no apparent nausea or vomiting Anesthetic complications: no   No notable events documented.  April Manson

## 2021-08-01 NOTE — H&P (Signed)
[facility]   Primary Care Physician:  Adin Hector, MD Ophthalmologist: Dr. George Ina  Pre-Procedure History & Physical: HPI:  Billy Berg is a 81 y.o. male here for cataract surgery.   Past Medical History:  Diagnosis Date   Anxiety    Arthritis    GERD (gastroesophageal reflux disease)    History of kidney stones    Hyperlipidemia    Hypertension    Sleep apnea    Wears hearing aid in both ears     Past Surgical History:  Procedure Laterality Date   CATARACT EXTRACTION W/PHACO Right 07/18/2021   Procedure: CATARACT EXTRACTION PHACO AND INTRAOCULAR LENS PLACEMENT ([place]) RIGHT;  Surgeon: Birder Robson, MD;  Location: [facility];  Service: Ophthalmology;  Laterality: Right;  sleep apnea 8.51 00:48.6   COLONOSCOPY     COLONOSCOPY WITH PROPOFOL N/A 10/14/2017   Procedure: COLONOSCOPY WITH PROPOFOL;  Surgeon: Manya Silvas, MD;  Location: [facility] ENDOSCOPY;  Service: Endoscopy;  Laterality: N/A;   JOINT REPLACEMENT Right 04/2007   KNEE   TONSILLECTOMY      Prior to Admission medications   Medication Sig Start Date End Date Taking? Authorizing Provider  amLODipine (NORVASC) 10 MG tablet Take 10 mg by mouth daily.   Yes [provider]  amoxicillin (AMOXIL) 500 MG tablet Take 2,000 mg by mouth. 1 hour prior to dental procedures   Yes [provider]  aspirin EC 81 MG tablet Take 81 mg by mouth daily.   Yes [provider]  atorvastatin (LIPITOR) 20 MG tablet Take 20 mg by mouth daily.   Yes [provider]  azelastine (ASTELIN) 0.1 % nasal spray Place 1 spray into both nostrils 2 (two) times daily. Use in each nostril as directed   Yes [provider]  Bacillus Coagulans-Inulin (ALIGN PREBIOTIC-PROBIOTIC PO) Take by mouth daily.   Yes [provider]  calcium carbonate (TUMS - DOSED IN MG ELEMENTAL CALCIUM) 500 MG chewable tablet Chew 1 tablet by mouth daily.   Yes [provider]   carboxymethylcellulose (REFRESH PLUS) 0.5 % SOLN 1 drop as needed.   Yes [provider]  donepezil (ARICEPT) 5 MG tablet Take 5 mg by mouth at bedtime.   Yes [provider]  fexofenadine (ALLEGRA) 180 MG tablet Take 180 mg by mouth daily.   Yes [provider]  losartan-hydrochlorothiazide (HYZAAR) 100-12.5 MG tablet Take 1 tablet by mouth daily.   Yes [provider]  Methylcellulose, Laxative, (CITRUCEL PO) Take by mouth daily.   Yes [provider]  Multiple Vitamin (MULTIVITAMIN) tablet Take 1 tablet by mouth daily. Optimal-M, Nutrifii multivit,  1 at breakfast, 1 at dinner Optimal-V Nutrifii multivit, 2 at breakfast, 1 at dinner   Yes [provider]  omega-3 acid ethyl esters (LOVAZA) 1 g capsule Take 1 g by mouth 2 (two) times daily.   Yes [provider]  omeprazole (PRILOSEC) 20 MG capsule Take 20 mg by mouth daily.   Yes [provider]  sertraline (ZOLOFT) 50 MG tablet Take 25 mg by mouth daily.   Yes [provider]  Turmeric 500 MG TABS Take 2,000 mg by mouth daily.   Yes [provider]    Allergies as of 06/09/2021 - Review Complete 10/14/2017  Allergen Reaction Noted   Hydrocodone Other (See Comments) 10/11/2017   Oxycodone Other (See Comments) 10/11/2017    History reviewed. No pertinent family history.  Social History   Socioeconomic History   Marital  status: Married    Spouse name: Not on file   Number of children: Not on file   Years of education: Not on file   Highest education level: Not on file  Occupational History   Not on file  Tobacco Use   Smoking status: Former    Packs/day: 1.00    Years: 10.00    Pack years: 10.00    Types: Cigarettes    Quit date: 01/10/1986    Years since quitting: 35.5   Smokeless tobacco: Never  Vaping Use   Vaping Use: Never used  Substance and Sexual Activity   Alcohol use: Yes    Alcohol/week: 7.0 standard drinks    Types: 7  Glasses of wine per week   Drug use: Never   Sexual activity: Not on file  Other Topics Concern   Not on file  Social History Narrative   Not on file   Social Determinants of Health   Financial Resource Strain: Not on file  Food Insecurity: Not on file  Transportation Needs: Not on file  Physical Activity: Not on file  Stress: Not on file  Social Connections: Not on file  Intimate Partner Violence: Not on file    Review of Systems: See HPI, otherwise negative ROS  Physical Exam: BP 132/83   Pulse 61   Temp 97.7 F (36.5 C) (Temporal)   Resp 18   Ht '5\' 10"'$  (1.778 m)   Wt 100.2 kg   SpO2 97%   BMI 31.71 kg/m  General:   Alert, cooperative in NAD Head:  Normocephalic and atraumatic. Respiratory:  Normal work of breathing. Cardiovascular:  RRR  Impression/Plan: Billy Berg is here for cataract surgery.  Risks, benefits, limitations, and alternatives regarding cataract surgery have been reviewed with the patient.  Questions have been answered.  All parties agreeable.   Birder Robson, MD  08/01/2021, 10:12 AM

## 2021-08-02 ENCOUNTER — Encounter: Payer: Self-pay | Admitting: Ophthalmology

## 2021-10-05 IMAGING — CT CT HEAD W/O CM
4 series · 15 of 47 positions shown, 17 images · non-contrast
Comparison: None.

CLINICAL DATA: Recent memory loss.

EXAM:
CT HEAD WITHOUT CONTRAST
TECHNIQUE: Contiguous axial images were obtained from the base of the skull
through the vertex without intravenous contrast.

[Series 2: axial st head 5.00 ax · axial · 0.38mm/px · z∈[-481,-369]mm · 6 of 35 slices shown, 8 images]
[im 5/35  brain]
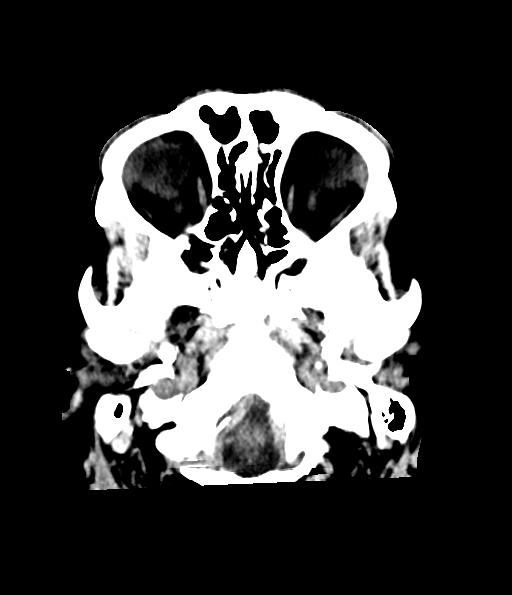
[im 5/35  bone]
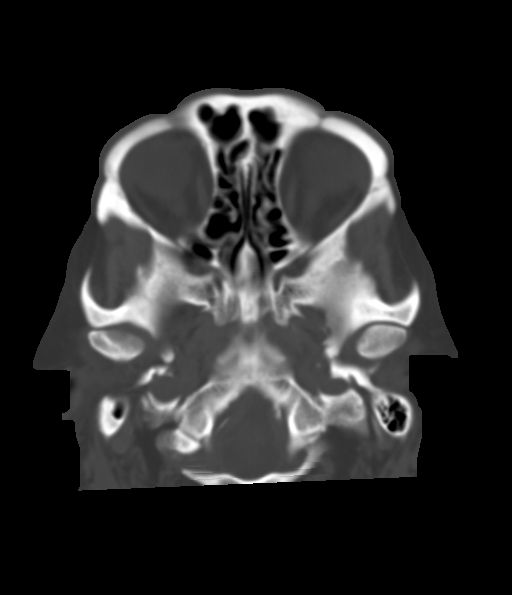
[im 10/35  brain]
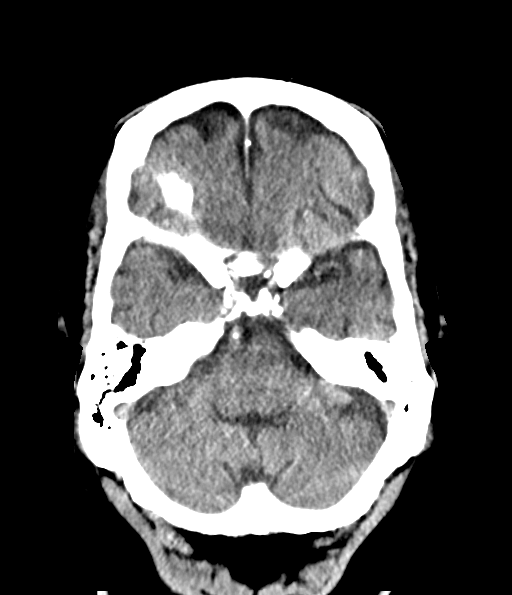
[im 15/35  brain]
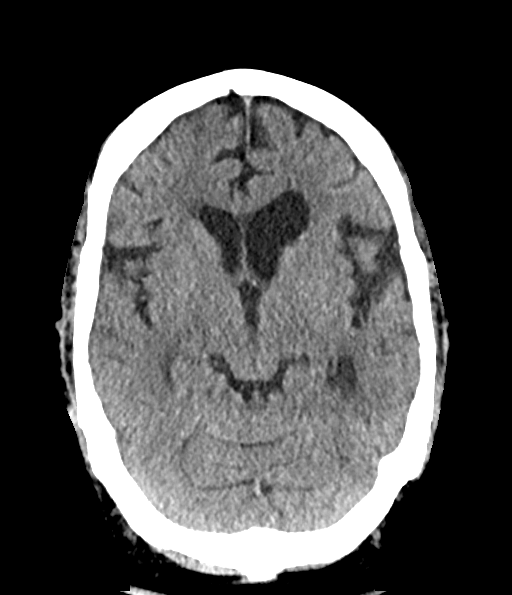
[im 20/35  brain]
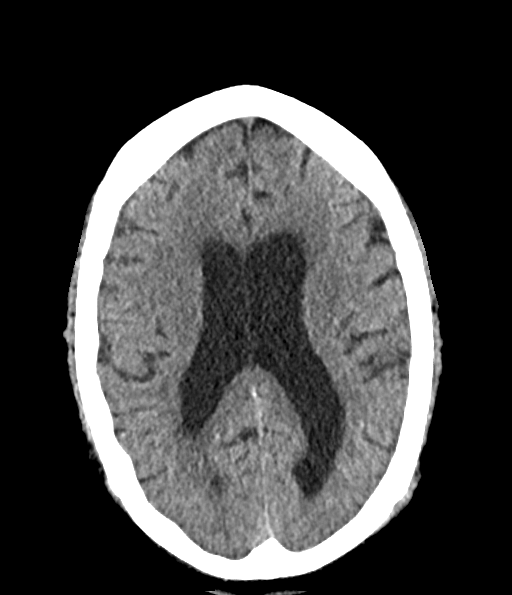
[im 25/35  brain]
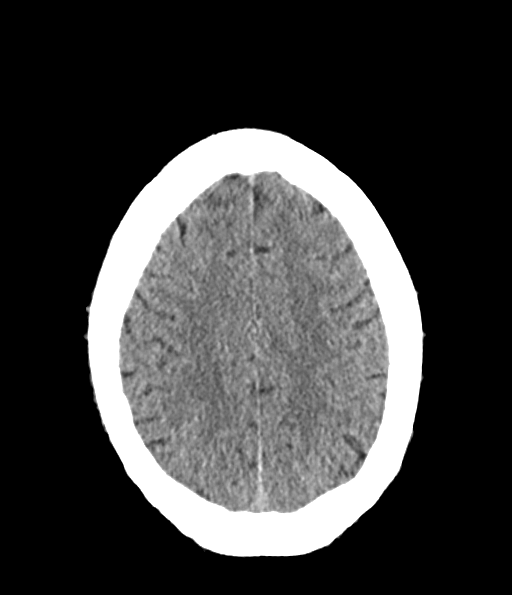
[im 25/35  bone]
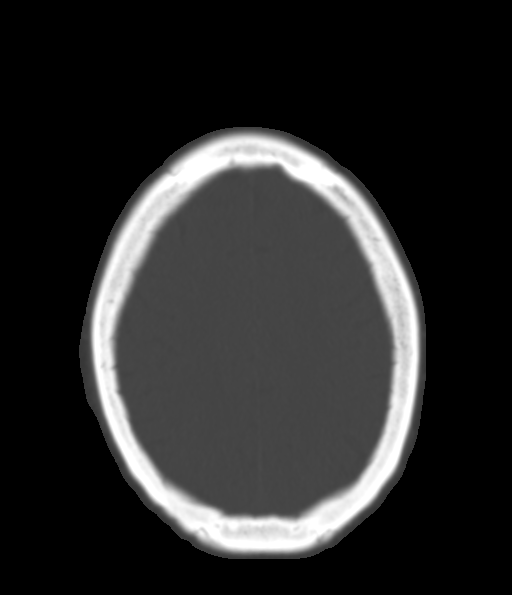
[im 30/35  brain]
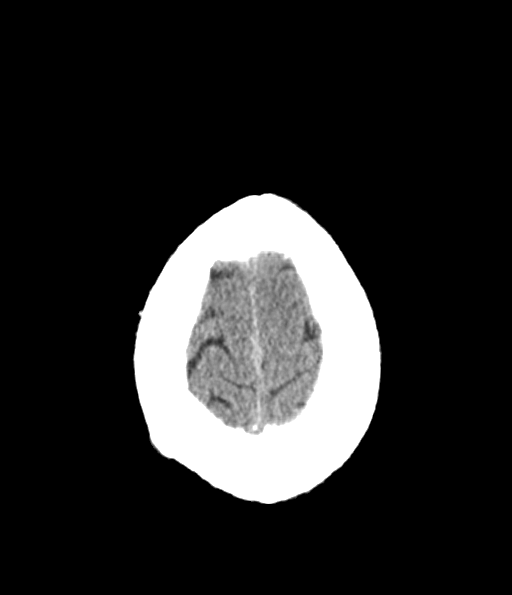

[Series 4: bone windows head 2.00 ax · axial · 0.38mm/px · z∈[-486,-448]mm · 3 of 89 slices shown]
[im 9/89  bone]
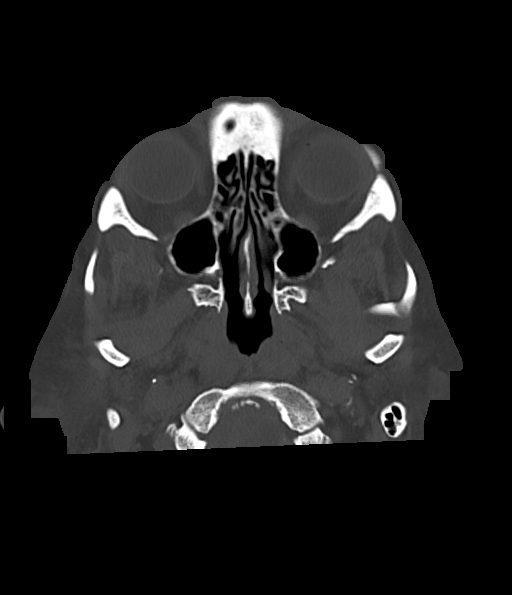
[im 17/89  bone]
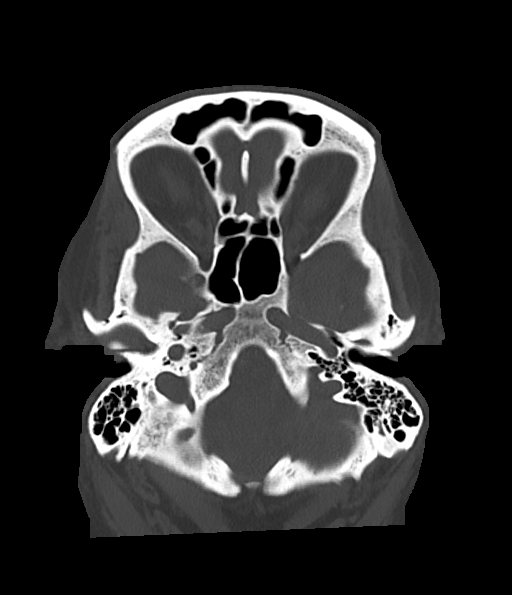
[im 30/89  bone]
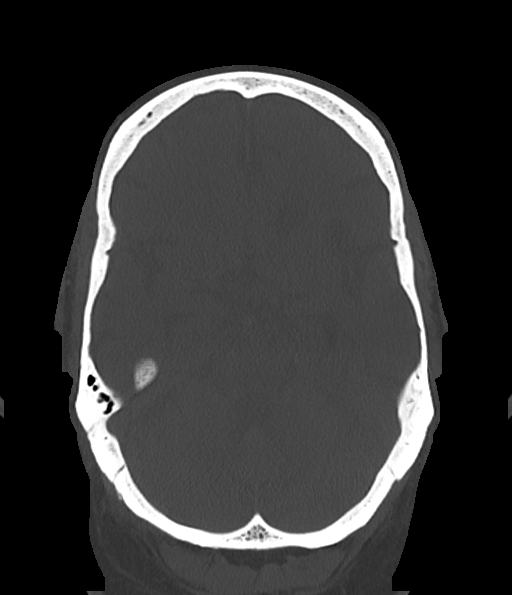

[Series 6: coronals head 3.00 cor · coronal · 0.35mm/px · 3 of 76 slices shown]
[im 28/76  brain]
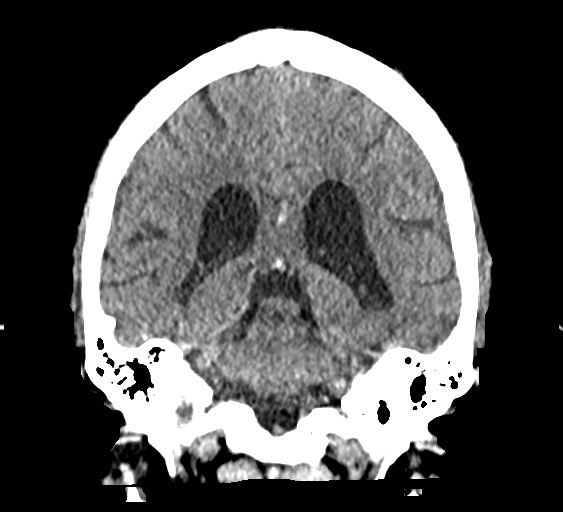
[im 35/76  brain]
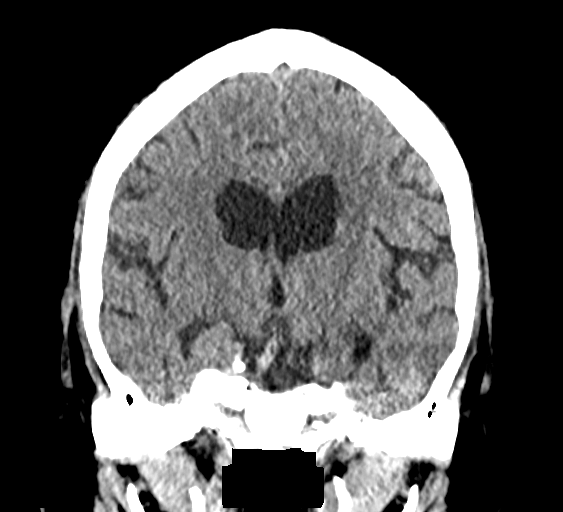
[im 41/76  brain]
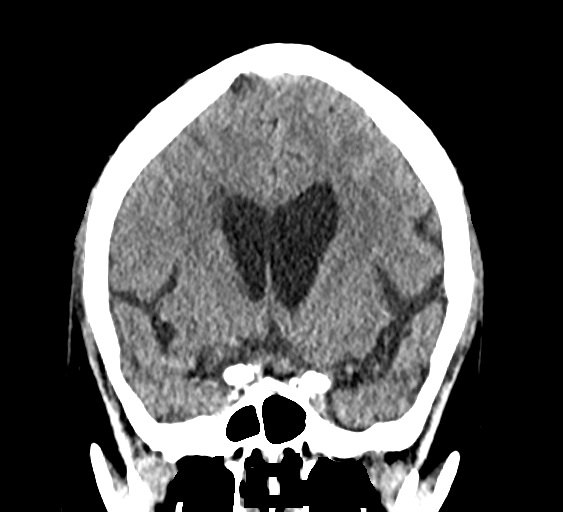

[Series 8: sagittals head 3.00 sag · sagittal · 0.35mm/px · 3 of 65 slices shown]
[im 22/65  brain]
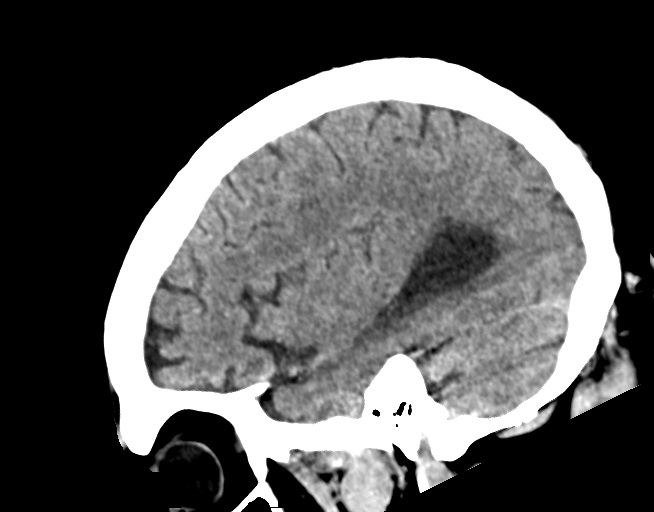
[im 33/65  brain]
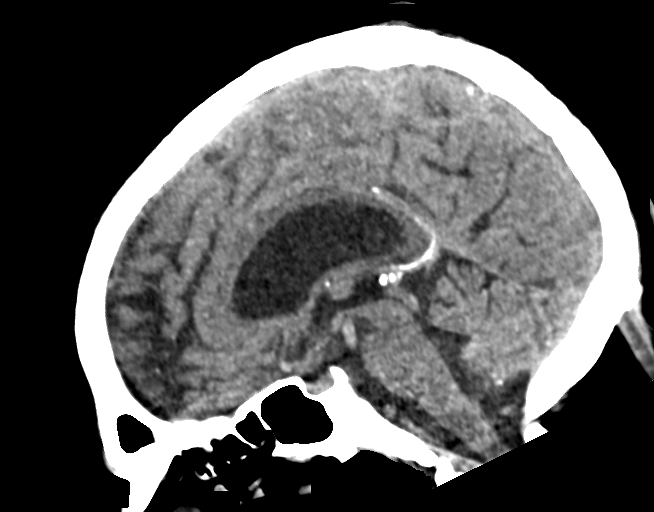
[im 43/65  brain]
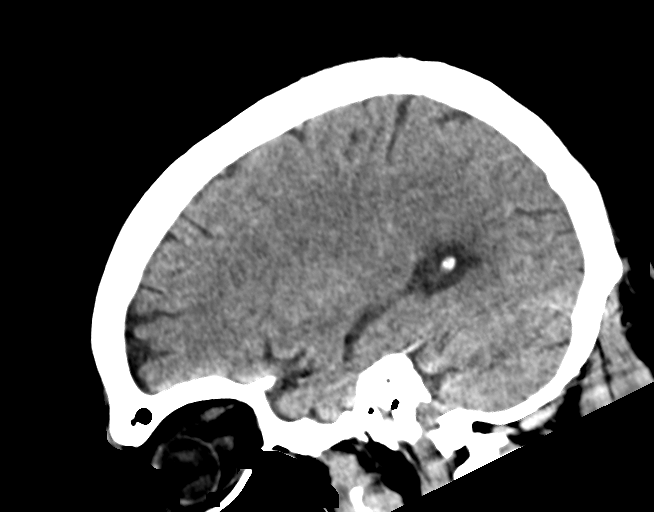

[15 of 47 positions shown; findings below may reference images not displayed]

FINDINGS: Brain: Moderate diffuse enlargement of the ventricles and mildly
prominent subarachnoid spaces. Mild patchy white matter low density
in both cerebral hemispheres. No intracranial hemorrhage, mass
lesion or CT evidence of acute infarction.

Vascular: No hyperdense vessel or unexpected calcification.

Skull: Normal. Negative for fracture or focal lesion.

Sinuses/Orbits: Unremarkable.

Other: None.
IMPRESSION: 1. No acute abnormality.
2. Mild to moderate diffuse cerebral and cerebellar atrophy. The
ventricles are enlarged out of proportion to the sulci, raising the
possibility of normal pressure hydrocephalus.
3. Mild chronic small vessel white matter ischemic changes in both
cerebral hemispheres.

## 2022-06-18 ENCOUNTER — Ambulatory Visit
Admission: EM | Admit: 2022-06-18 | Discharge: 2022-06-18 | Disposition: A | Discharge status: Home / Self Care | Payer: Medicare Other | Attending: Urgent Care | Admitting: Urgent Care

## 2022-06-18 DIAGNOSIS — K0889 Other specified disorders of teeth and supporting structures: Secondary | ICD-10-CM | POA: Diagnosis not present

## 2022-06-18 MED ORDER — AMOXICILLIN-POT CLAVULANATE 875-125 MG PO TABS: 1.0000 | ORAL_TABLET | Freq: Two times a day (BID) | ORAL | 0 refills | Status: DC

## 2022-06-18 NOTE — ED Provider Notes (Signed)
[facility]    CSN: [id number] Arrival date & time: 06/18/22  1544      History   Chief Complaint Chief Complaint  Patient presents with  . Otalgia    HPI Billy Berg is a 82 y.o. male.    Otalgia  Accompanied by his wife.  Presents with complaint of left ear pain and jaw pain.  Denies recent upper respiratory infection or sinus congestion.  He does endorse history of "allergies".  No ear "popping" or sensation of "clogged".   Past Medical History:  Diagnosis Date  . Anxiety   . Arthritis   . GERD (gastroesophageal reflux disease)   . History of kidney stones   . Hyperlipidemia   . Hypertension   . Sleep apnea   . Wears hearing aid in both ears     There are no problems to display for this patient.   Past Surgical History:  Procedure Laterality Date  . CATARACT EXTRACTION W/PHACO Right 07/18/2021   Procedure: CATARACT EXTRACTION PHACO AND INTRAOCULAR LENS PLACEMENT ([facility]) RIGHT;  Surgeon: Galen ManilaPorfilio, William, MD;  [facility];  Service: Ophthalmology;  Laterality: Right;  sleep apnea 8.51 00:48.6  . CATARACT EXTRACTION W/PHACO Left 08/01/2021   Procedure: CATARACT EXTRACTION PHACO AND INTRAOCULAR LENS PLACEMENT ([facility]) LEFT 8.06 01:02.5;  Surgeon: Galen ManilaPorfilio, William, MD;  [facility];  Service: Ophthalmology;  Laterality: Left;  sleep apnea  . COLONOSCOPY    . COLONOSCOPY WITH PROPOFOL N/A 10/14/2017   Procedure: COLONOSCOPY WITH PROPOFOL;  Surgeon: Scot JunElliott, Robert T, MD;  [facility];  Service: Endoscopy;  Laterality: N/A;  . 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 TONSILLECTOMY         Home Medications    Prior to Admission medications   Medication Sig Start Date End Date Taking? Authorizing Provider  amLODipine (NORVASC) 10 MG tablet Take 10 mg by mouth daily.    [provider]  amoxicillin (AMOXIL) 500 MG tablet Take 2,000 mg by mouth. 1 hour prior to dental procedures    [provider]  aspirin EC 81 MG tablet Take 81 mg by mouth daily.    [provider]  atorvastatin (LIPITOR) 20 MG tablet Take 20 mg by mouth daily.    [provider]  azelastine (ASTELIN) 0.1 % nasal spray Place 1 spray into both nostrils 2 (two) times daily. Use in each nostril as directed    [provider]  Bacillus Coagulans-Inulin (ALIGN PREBIOTIC-PROBIOTIC PO) Take by mouth daily.    [provider]  calcium carbonate (TUMS - DOSED IN MG ELEMENTAL CALCIUM) 500 MG chewable tablet Chew 1 tablet by mouth daily.    [provider]  carboxymethylcellulose (REFRESH PLUS) 0.5 % SOLN 1 drop as needed.    [provider]  donepezil (ARICEPT) 5 MG tablet Take 5 mg by mouth at bedtime.    [provider]  fexofenadine (ALLEGRA) 180 MG tablet Take 180 mg by mouth daily.    [provider]  losartan-hydrochlorothiazide (HYZAAR) 100-12.5 MG tablet Take 1 tablet by mouth daily.    [provider]  Methylcellulose, Laxative, (CITRUCEL PO) Take by mouth daily.    [provider]  Multiple Vitamin (MULTIVITAMIN) tablet Take 1 tablet by mouth daily. Optimal-M, Nutrifii multivit,  1 at breakfast, 1 at dinner Optimal-V Nutrifii multivit, 2 at breakfast, 1 at dinner    [provider]  omega-3 acid ethyl esters (LOVAZA) 1 g capsule Take 1 g  by mouth 2 (two) times daily.    [provider]  omeprazole (PRILOSEC) 20 MG capsule Take 20 mg by mouth daily.    [provider]  sertraline (ZOLOFT) 50 MG tablet Take 25 mg by mouth daily.    [provider]  Turmeric 500 MG TABS Take 2,000 mg by mouth daily.    [provider]    Family History History reviewed. No pertinent family history.  Social History Social History   Tobacco Use  . Smoking status: Former    Packs/day: 1.00    Years: 10.00    Additional pack years: 0.00    Total pack years: 10.00    Types: Cigarettes     Quit date: 01/10/1986    Years since quitting: 36.4  . Smokeless tobacco: Never  Vaping Use  . Vaping Use: Never used  Substance Use Topics  . Alcohol use: Yes    Alcohol/week: 7.0 standard drinks of alcohol    Types: 7 Glasses of wine per week  . Drug use: Never     Allergies   Hydrocodone and Oxycodone   Review of Systems Review of Systems  HENT:  Positive for ear pain.      Physical Exam Triage Vital Signs ED Triage Vitals  Enc Vitals Group     BP 06/18/22 1557 (!) 143/73     Pulse Rate 06/18/22 1557 69     Resp 06/18/22 1557 18     Temp 06/18/22 1557 98.1 F (36.7 C)     Temp Source 06/18/22 1557 Oral     SpO2 06/18/22 1557 94 %     Weight --      Height --      Head Circumference --      Peak Flow --      Pain Score 06/18/22 1559 4     Pain Loc --      Pain Edu? --      Excl. in GC? --    No data found.  Updated Vital Signs BP (!) 143/73 (BP Location: Left Arm)   Pulse 69   Temp 98.1 F (36.7 C) (Oral)   Resp 18   SpO2 94%   Visual Acuity Right Eye Distance:   Left Eye Distance:   Bilateral Distance:    Right Eye Near:   Left Eye Near:    Bilateral Near:     Physical Exam HENT:     Head:     Jaw: Pain on movement present.      Mouth/Throat:     Dentition: Abnormal dentition. Dental caries present.      UC Treatments / Results  Labs (all labs ordered are listed, but only abnormal results are displayed) Labs Reviewed - No data to display  EKG   Radiology No results found.  Procedures Procedures (including critical care time)  Medications Ordered in UC Medications - No data to display  Initial Impression / Assessment and Plan / UC Course  I have reviewed the triage vital signs and the nursing notes.  Pertinent labs & imaging results that were available during my care of the patient were reviewed by me and considered in my medical decision making (see chart for details).   Left TM is WNL.  Left EAC is nonerythematous and  nonedematous.  There is tenderness in the left EAC with exam.  There is left jaw pain with movement.  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  No lower gingival swelling or gum lesions  are visible.  Left sided upper molars have fillings.  No upper gingival swelling or gum lesions are visible.  Possible cracked filling.  Will treat presumptive dental abscess with Augmentin and asked patient to follow-up with his dentist.  Counseled patient on potential for adverse effects with medications prescribed/recommended today, ER and return-to-clinic precautions discussed, patient verbalized understanding and agreement with care plan.   Final Clinical Impressions(s) / UC Diagnoses   Final diagnoses:  None   Discharge Instructions   None    ED Prescriptions   None    PDMP not reviewed this encounter.   Charma Igo, [place] 06/18/22 [phone]

## 2022-06-18 NOTE — Discharge Instructions (Signed)
Please follow-up by scheduling an evaluation with your dentist.

## 2022-06-18 NOTE — ED Triage Notes (Signed)
Patient presents to [facility] for left ear pain since Sat.

## 2022-07-03 ENCOUNTER — Encounter: Payer: Self-pay | Admitting: Gastroenterology

## 2022-07-05 ENCOUNTER — Ambulatory Visit: Admission: RE | Admit: 2022-07-05 | Payer: Medicare Other | Source: Home / Self Care | Admitting: Gastroenterology

## 2022-07-05 ENCOUNTER — Encounter: Admission: RE | Payer: Self-pay | Source: Home / Self Care

## 2022-07-05 SURGERY — COLONOSCOPY
Anesthesia: General

## 2022-07-25 ENCOUNTER — Ambulatory Visit: Payer: Medicare Other | Attending: Internal Medicine

## 2022-07-25 DIAGNOSIS — R262 Difficulty in walking, not elsewhere classified: Secondary | ICD-10-CM | POA: Insufficient documentation

## 2022-07-25 DIAGNOSIS — M542 Cervicalgia: Secondary | ICD-10-CM | POA: Insufficient documentation

## 2022-07-25 DIAGNOSIS — R278 Other lack of coordination: Secondary | ICD-10-CM | POA: Insufficient documentation

## 2022-07-25 DIAGNOSIS — M6281 Muscle weakness (generalized): Secondary | ICD-10-CM | POA: Insufficient documentation

## 2022-07-25 DIAGNOSIS — R2689 Other abnormalities of gait and mobility: Secondary | ICD-10-CM | POA: Diagnosis present

## 2022-07-25 DIAGNOSIS — R2681 Unsteadiness on feet: Secondary | ICD-10-CM | POA: Insufficient documentation

## 2022-07-25 DIAGNOSIS — R269 Unspecified abnormalities of gait and mobility: Secondary | ICD-10-CM | POA: Insufficient documentation

## 2022-07-25 DIAGNOSIS — M5382 Other specified dorsopathies, cervical region: Secondary | ICD-10-CM | POA: Insufficient documentation

## 2022-07-25 NOTE — Therapy (Signed)
OUTPATIENT PHYSICAL THERAPY CERVICAL and BALANCE EVALUATION   Patient Name: Billy Berg MRN: [id number] DOB:[birth year], 82 y.o., male Today's Date: 07/26/2022  END OF SESSION:  PT End of Session - 07/25/22 1611     Visit Number 1    Number of Visits 24    Date for PT Re-Evaluation 10/17/22    Progress Note Due on Visit 10    PT Start Time 1604    PT Stop Time 1659    PT Time Calculation (min) 55 min    Equipment Utilized During Treatment Gait belt    Activity Tolerance Patient tolerated treatment well    Behavior During Therapy WFL for tasks assessed/performed             Past Medical History:  Diagnosis Date   Anxiety    Arthritis    GERD (gastroesophageal reflux disease)    History of kidney stones    Hyperlipidemia    Hypertension    Sleep apnea    Wears hearing aid in both ears    Past Surgical History:  Procedure Laterality Date   CATARACT EXTRACTION W/PHACO Right 07/18/2021   Procedure: CATARACT EXTRACTION PHACO AND INTRAOCULAR LENS PLACEMENT (IOC) RIGHT;  Surgeon: Galen Manila, MD;  Location: [facility];  Service: Ophthalmology;  Laterality: Right;  sleep apnea 8.51 00:48.6   CATARACT EXTRACTION W/PHACO Left 08/01/2021   Procedure: CATARACT EXTRACTION PHACO AND INTRAOCULAR LENS PLACEMENT (IOC) LEFT 8.06 01:02.5;  Surgeon: Galen Manila, MD;  Location: [facility];  Service: Ophthalmology;  Laterality: Left;  sleep apnea   COLONOSCOPY     COLONOSCOPY WITH PROPOFOL N/A 10/14/2017   Procedure: COLONOSCOPY WITH PROPOFOL;  Surgeon: Scot Jun, MD;  Location: [facility];  Service: Endoscopy;  Laterality: N/A;   JOINT REPLACEMENT Right 04/2007   KNEE   TONSILLECTOMY     There are no problems to display for this patient.   PCP: Dr Bynum Sites  REFERRING PROVIDER: Dr. Mcdonald Sites  REFERRING DIAG: Neck pain; Balance disorder  THERAPY DIAG:  Cervicalgia  Limited active range of motion of cervical spine on rotation to  right  Muscle weakness (generalized)  Limited active range of motion (AROM) of cervical spine on lateral flexion to left  Imbalance  Abnormality of gait and mobility  Difficulty in walking, not elsewhere classified  Other lack of coordination  Unsteadiness on feet  Rationale for Evaluation and Treatment: Rehabilitation  ONSET DATE: Around 6 months ago (02/2022)  SUBJECTIVE:  SUBJECTIVE STATEMENT: Patient reports his neck especially the left side upper trap is tight causing him pain and limited active motion of head. He also states he has been unsteady for at least last 6 months with a couple of falls. He is interested in improving the pain on left side and walking better without falling.    Hand dominance: Right  PERTINENT HISTORY:   Patient presents with referral to outpatient PT following appt with Dr. Graciela Husbands for annual exam- including some neck and back pain and imbalance.   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  COLONOSCOPY 12/03/2013  Four 1-5 mm adenomas, sigmoid diverticulosis, internal hemorrhoids; repeat in 11/2016.  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 Yes: NPRS scale: 3-4/10 Pain location: Left upper trap region Pain description: ache Aggravating factors: lifting/driving, turning my head Relieving factors: rest, meds, occasional use of heat  PRECAUTIONS: Fall  WEIGHT BEARING  RESTRICTIONS: No  FALLS:  Has patient fallen in last 6 months? Yes. Number of falls 2  LIVING ENVIRONMENT: Lives with: lives with their spouse Lives in: House/apartment Stairs: Yes: Internal: 12 steps; can reach both Has following equipment at home: None  OCCUPATION: Retired Software engineer  PLOF: Independent  PATIENT GOALS: To have improved flexibility in my neck and improve my balance  NEXT MD VISIT: none  OBJECTIVE:   DIAGNOSTIC FINDINGS:  None recent  PATIENT SURVEYS:  NDI 24% indicating mild disability FOTO 57  COGNITION: Overall cognitive status: Within functional limits for tasks assessed- does revert to wife at times to answer questions  SENSATION: WFL with all 4 extremities  POSTURE: rounded shoulders and forward head  PALPATION: (+) tenderness along left Upper trap region with taught bands identified   CERVICAL ROM:   Active ROM A/PROM (deg) eval  Flexion 45  Extension 68  Right lateral flexion 18*  Left lateral flexion 26*  Right rotation 75*  Left rotation 52*   (Blank rows = not tested)  UPPER EXTREMITY ROM:  Active ROM Right eval Left eval  Shoulder flexion    Shoulder extension    Shoulder abduction    Shoulder adduction    Shoulder extension    Shoulder internal rotation    Shoulder external rotation    Elbow flexion    Elbow extension    Wrist flexion    Wrist extension    Wrist ulnar deviation    Wrist radial deviation    Wrist pronation    Wrist supination     (Blank rows = not tested)  UPPER EXTREMITY MMT:  MMT Right eval Left eval  Shoulder flexion 5 4  Shoulder extension NT 4  Shoulder abduction 5 4  Shoulder adduction 5 4  Shoulder internal rotation 5 4  Shoulder external rotation 5 4  Elbow flexion 5 4+  Elbow extension 5 4+   (Blank rows = not tested)  Strength R/L 4/4 Hip flexion 4/4 Hip external rotation 4/4 Hip internal rotation 4/4 Hip extension  4/4 Hip abduction 5/5 Hip  adduction 4/4 Knee extension 5/5 Knee flexion NT- Ankle Plantarflexion 5/5 Ankle Dorsiflexion     CERVICAL SPECIAL TESTS:  Spurling's test:  Negative   Gait - Wide base of support- decreased step length- waddle gait pattern approx 120 feet total.   FUNCTIONAL TESTS:  5 times sit to stand: 19.48 10 meter walk test: 15.10sec or 0.66 m/s Functional gait assessment: 15/30  TODAY'S TREATMENT:                                                                                                                              DATE: 07/25/2022   PATIENT EDUCATION:  Education details: Purpose of PT; plan of care Person educated: Patient/wife Education method: Explanation, Demonstration, Tactile cues, and Verbal cues Education comprehension: verbalized understanding, returned demonstration, verbal cues required, tactile cues required, and needs further education  HOME EXERCISE PROGRAM: Access Code: [email or web address] URL: [email or web address] Date: 07/25/2022 Prepared by: Maureen Ralphs  Exercises - Seated Cervical Sidebending Stretch  - 2 x daily - 3 sets - 30 sec hold - Seated Cervical Rotation AROM  - 2 x daily - 3 sets - 10 reps  ASSESSMENT:  CLINICAL IMPRESSION: Patient is a 82  y.o. male who was seen today for physical therapy evaluation and treatment for neck pain and balance disorder. He presents today with pain limited cervical ROM, some Left UE and LE muscle weakness and impaired balance as seen by reduced Functional gait analysis score. He will benefit from skilled PT services to improve his cervical ROM, overall strength and mobility with decreased risk of falling.   OBJECTIVE IMPAIRMENTS: Abnormal gait, decreased activity tolerance, decreased balance, decreased coordination, decreased endurance, decreased mobility, difficulty walking, decreased ROM, decreased strength, hypomobility, and pain.   ACTIVITY LIMITATIONS: carrying, lifting, bending, standing, squatting,  stairs, reach over head, and hygiene/grooming  PARTICIPATION LIMITATIONS: cleaning, laundry, driving, shopping, community activity, and yard work  PERSONAL FACTORS: Age and 1-2 comorbidities: anxiety, HTN  are also affecting patient's functional outcome.   REHAB POTENTIAL: Good  CLINICAL DECISION MAKING: Evolving/moderate complexity  EVALUATION COMPLEXITY: Moderate   GOALS: Goals reviewed with patient? Yes  SHORT TERM GOALS: Target date: 09/05/2022  Pt will be independent with HEP in order to improve strength and decrease back pain in order to improve pain-free function at home and work.    Baseline: EVAL- Patient with no formal HEP in place Goal status: INITIAL    LONG TERM GOALS: Target date: 10/17/2022  Pt will decrease worst neck pain as reported on NPRS by at least 2 points in order to demonstrate clinically significant reduction in back pai Baseline: EVAL = 4/10 Goal status: INITIAL  2.  Pt will demonstrate decrease in NDI by at least 19% in order to demonstrate clinically significant reduction in disability related to neck injury/pain  Baseline: Eval= 24% Goal status: INITIAL  3.  Pt will improve FOTO to target score to display perceived improvements in ability to complete ADL's.  Baseline:  Goal status: INITIAL  4.  Pt will improve FGA by at least 4 points in order to demonstrate clinically significant improvement in balance.  Baseline: 15/30 Goal status: INITIAL  5.  Pt will decrease 5TSTS by at least 3 seconds in order to demonstrate clinically significant improvement in LE strength. Baseline: 19.48 sec  Goal status: INITIAL  6.  Pt will increase by at least 0.13 m/s in order to demonstrate clinically significant improvement in community ambulation.   Baseline: EVAL= 0.66 m/s Goal status: INITIAL  7.  Patient will present with > 5 deg improved cervical Rotation with active motion for improved ability turn his head while walking or driving.  Baseline:  EVAL- Cervical Rotation L/R= 52/75 deg Goal status: INITIAL  PLAN:  PT FREQUENCY: 1-2x/week  PT DURATION: 12 weeks  PLANNED INTERVENTIONS: Therapeutic exercises, Therapeutic activity, Neuromuscular re-education, Balance training, Gait training, Patient/Family education, Self Care, Joint mobilization, Joint manipulation, Stair training, Vestibular training, Canalith repositioning, DME instructions, Dry Needling, Electrical stimulation, Spinal manipulation, Spinal mobilization, Cryotherapy, Moist heat, Manual therapy, and Re-evaluation  PLAN FOR NEXT SESSION: Initiate manual therapy for pain relief and ROM of c-spine and balance training for impaired balance.    Lenda Kelp, PT 07/26/2022, 3:27 PM

## 2022-07-31 ENCOUNTER — Ambulatory Visit: Payer: Medicare Other

## 2022-07-31 DIAGNOSIS — R262 Difficulty in walking, not elsewhere classified: Secondary | ICD-10-CM

## 2022-07-31 DIAGNOSIS — M6281 Muscle weakness (generalized): Secondary | ICD-10-CM

## 2022-07-31 DIAGNOSIS — M5382 Other specified dorsopathies, cervical region: Secondary | ICD-10-CM

## 2022-07-31 DIAGNOSIS — R278 Other lack of coordination: Secondary | ICD-10-CM

## 2022-07-31 DIAGNOSIS — M542 Cervicalgia: Secondary | ICD-10-CM

## 2022-07-31 DIAGNOSIS — R269 Unspecified abnormalities of gait and mobility: Secondary | ICD-10-CM

## 2022-07-31 DIAGNOSIS — R2689 Other abnormalities of gait and mobility: Secondary | ICD-10-CM

## 2022-07-31 DIAGNOSIS — R2681 Unsteadiness on feet: Secondary | ICD-10-CM

## 2022-07-31 NOTE — Therapy (Signed)
OUTPATIENT PHYSICAL THERAPY CERVICAL and BALANCE TREATMENT   Patient Name: Trevionne Santor MRN: [id number] DOB:07/05/1940, 82 y.o., male Today's Date: 08/01/2022  END OF SESSION:  PT End of Session - 07/31/22 1303     Visit Number 2    Number of Visits 24    Date for PT Re-Evaluation 10/17/22    Progress Note Due on Visit 10    PT Start Time 1303    PT Stop Time 1344    PT Time Calculation (min) 41 min    Equipment Utilized During Treatment Gait belt    Activity Tolerance Patient tolerated treatment well    Behavior During Therapy WFL for tasks assessed/performed             Past Medical History:  Diagnosis Date   Anxiety    Arthritis    GERD (gastroesophageal reflux disease)    History of kidney stones    Hyperlipidemia    Hypertension    Sleep apnea    Wears hearing aid in both ears    Past Surgical History:  Procedure Laterality Date   CATARACT EXTRACTION W/PHACO Right 07/18/2021   Procedure: CATARACT EXTRACTION PHACO AND INTRAOCULAR LENS PLACEMENT (IOC) RIGHT;  Surgeon: Galen Manila, MD;  Location: [facility];  Service: Ophthalmology;  Laterality: Right;  sleep apnea 8.51 00:48.6   CATARACT EXTRACTION W/PHACO Left 08/01/2021   Procedure: CATARACT EXTRACTION PHACO AND INTRAOCULAR LENS PLACEMENT (IOC) LEFT 8.06 01:02.5;  Surgeon: Galen Manila, MD;  Location: [facility];  Service: Ophthalmology;  Laterality: Left;  sleep apnea   COLONOSCOPY     COLONOSCOPY WITH PROPOFOL N/A 10/14/2017   Procedure: COLONOSCOPY WITH PROPOFOL;  Surgeon: Scot Jun, MD;  Location: [facility];  Service: Endoscopy;  Laterality: N/A;   JOINT REPLACEMENT Right 04/2007   KNEE   TONSILLECTOMY     There are no problems to display for this patient.   PCP: Dr Sharron Sites  REFERRING PROVIDER: Dr. Germaine Sites  REFERRING DIAG: Neck pain; Balance disorder  THERAPY DIAG:  Cervicalgia  Limited active range of motion of cervical spine on rotation to  right  Muscle weakness (generalized)  Limited active range of motion (AROM) of cervical spine on lateral flexion to left  Imbalance  Abnormality of gait and mobility  Difficulty in walking, not elsewhere classified  Other lack of coordination  Unsteadiness on feet  Rationale for Evaluation and Treatment: Rehabilitation  ONSET DATE: Around 6 months ago (02/2022)  SUBJECTIVE:  SUBJECTIVE STATEMENT: Patient reports his neck has been bothering him worse of past week. States difficulty moving his head at times. Reports having a headache yesterday but none today.    Hand dominance: Right  PERTINENT HISTORY:   Patient presents with referral to outpatient PT following appt with Dr. Graciela Husbands for annual exam- including some neck and back pain and imbalance.   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  COLONOSCOPY 12/03/2013  Four 1-5 mm adenomas, sigmoid diverticulosis, internal hemorrhoids; repeat in 11/2016.  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 Yes: NPRS scale: 3-4/10 Pain location: Left upper trap region Pain description: ache Aggravating factors: lifting/driving, turning my head Relieving factors: rest, meds, occasional use of heat  PRECAUTIONS: Fall  WEIGHT BEARING RESTRICTIONS: No  FALLS:  Has patient fallen in last 6 months? Yes. Number of falls 2  LIVING ENVIRONMENT: Lives with: lives  with their spouse Lives in: House/apartment Stairs: Yes: Internal: 12 steps; can reach both Has following equipment at home: None  OCCUPATION: Retired Software engineer  PLOF: Independent  PATIENT GOALS: To have improved flexibility in my neck and improve my balance  NEXT MD VISIT: none  OBJECTIVE:   DIAGNOSTIC FINDINGS:  None recent  PATIENT SURVEYS:  NDI 24% indicating mild disability FOTO 22  COGNITION: Overall cognitive status: Within functional limits for tasks assessed- does revert to wife at times to answer questions  SENSATION: WFL with all 4 extremities  POSTURE: rounded shoulders and forward head  PALPATION: (+) tenderness along left Upper trap region with taught bands identified   CERVICAL ROM:   Active ROM A/PROM (deg) eval  Flexion 45  Extension 68  Right lateral flexion 18*  Left lateral flexion 26*  Right rotation 75*  Left rotation 52*   (Blank rows = not tested)    UPPER EXTREMITY MMT:  MMT Right eval Left eval  Shoulder flexion 5 4  Shoulder extension NT 4  Shoulder abduction 5 4  Shoulder adduction 5 4  Shoulder internal rotation 5 4  Shoulder external rotation 5 4  Elbow flexion 5 4+  Elbow extension 5 4+   (Blank rows = not tested)  Strength R/L 4/4 Hip flexion 4/4 Hip external rotation 4/4 Hip internal rotation 4/4 Hip extension  4/4 Hip abduction 5/5 Hip adduction 4/4 Knee extension 5/5 Knee flexion NT- Ankle Plantarflexion 5/5 Ankle Dorsiflexion     CERVICAL SPECIAL TESTS:  Spurling's test: Negative   Gait - Wide base of support- decreased step length- waddle gait pattern approx 120 feet total.   FUNCTIONAL TESTS:  5 times sit to stand: 19.48 10 meter walk test: 15.10sec or 0.66 m/s Functional gait assessment: 15/30  TODAY'S TREATMENT:  DATE: 07/31/2022    Manual  therapy:   -Passive ROM (Cervical SB and Rotation)  - Gentle cervical distraction  -Joint mobs- PA to C3-T1 grade 2-3  -STM to Upper cervical paraspinals, levator, and Upper trap region x 15 min  Therapeutic Exercises:  -- Seated Shoulder Rolls  - 3 sets - 10 reps - Standing Shoulder Row with GTB- 3 sets - 10 reps - Supine Chin Tuck  - 10 reps - Shoulder extension with GTB-resistance - 2 sets - 10 reps   PATIENT EDUCATION:  Education details: Purpose of PT; plan of care Person educated: Patient/wife Education method: Explanation, Demonstration, Tactile cues, and Verbal cues Education comprehension: verbalized understanding, returned demonstration, verbal cues required, tactile cues required, and needs further education  HOME EXERCISE PROGRAM: Access Code: [email or web address] URL: [email or web address] Date: 07/31/2022 Prepared by: Maureen Ralphs  Exercises - Seated Shoulder Rolls  - 1 x daily - 7 x weekly - 3 sets - 10 reps - Standing Shoulder Row with Anchored Resistance  - 3 x weekly - 3 sets - 10 reps - Supine Chin Tuck  - 1 x daily - 7 x weekly - 3 sets - 10 reps - Shoulder extension with resistance - Neutral  - 1 x daily - 3 x weekly - 3 sets - 10 reps     Access Code: [email or web address] URL: [email or web address] Date: 07/25/2022 Prepared by: Maureen Ralphs  Exercises - Seated Cervical Sidebending Stretch  - 2 x daily - 3 sets - 30 sec hold - Seated Cervical Rotation AROM  - 2 x daily - 3 sets - 10 reps  ASSESSMENT:  CLINICAL IMPRESSION: Patient presents for 2nd visit today and treatment focused on Cervical/Upper trap pain. He presented with increased overall tightness with multiple trigger points. He benefited from some manual therapy and able to relax and stated feeling better after session. He was introduced to some postural strengthening with increased VC and visual demonstration for correct technique. He will need review next session to ensure  he is performing correctly. Patient will benefit from skilled PT services to improve his cervical ROM, overall strength and mobility with decreased risk of falling.   OBJECTIVE IMPAIRMENTS: Abnormal gait, decreased activity tolerance, decreased balance, decreased coordination, decreased endurance, decreased mobility, difficulty walking, decreased ROM, decreased strength, hypomobility, and pain.   ACTIVITY LIMITATIONS: carrying, lifting, bending, standing, squatting, stairs, reach over head, and hygiene/grooming  PARTICIPATION LIMITATIONS: cleaning, laundry, driving, shopping, community activity, and yard work  PERSONAL FACTORS: Age and 1-2 comorbidities: anxiety, HTN  are also affecting patient's functional outcome.   REHAB POTENTIAL: Good  CLINICAL DECISION MAKING: Evolving/moderate complexity  EVALUATION COMPLEXITY: Moderate   GOALS: Goals reviewed with patient? Yes  SHORT TERM GOALS: Target date: 09/05/2022  Pt will be independent with HEP in order to improve strength and decrease back pain in order to improve pain-free function at home and work.    Baseline: EVAL- Patient with no formal HEP in place Goal status: INITIAL    LONG TERM GOALS: Target date: 10/17/2022  Pt will decrease worst neck pain as reported on NPRS by at least 2 points in order to demonstrate clinically significant reduction in back pai Baseline: EVAL = 4/10 Goal status: INITIAL  2.  Pt will demonstrate decrease in NDI by at least 19% in order to demonstrate clinically significant reduction in disability related to neck injury/pain  Baseline: Eval= 24% Goal status: INITIAL  3.  Pt will improve FOTO to target score to display perceived improvements in ability to  complete ADL's.  Baseline:  Goal status: INITIAL  4.  Pt will improve FGA by at least 4 points in order to demonstrate clinically significant improvement in balance.   Baseline: 15/30 Goal status: INITIAL  5.  Pt will decrease 5TSTS by at least  3 seconds in order to demonstrate clinically significant improvement in LE strength. Baseline: 19.48 sec  Goal status: INITIAL  6.  Pt will increase by at least 0.13 m/s in order to demonstrate clinically significant improvement in community ambulation.   Baseline: EVAL= 0.66 m/s Goal status: INITIAL  7.  Patient will present with > 5 deg improved cervical Rotation with active motion for improved ability turn his head while walking or driving.  Baseline: EVAL- Cervical Rotation L/R= 52/75 deg Goal status: INITIAL  PLAN:  PT FREQUENCY: 1-2x/week  PT DURATION: 12 weeks  PLANNED INTERVENTIONS: Therapeutic exercises, Therapeutic activity, Neuromuscular re-education, Balance training, Gait training, Patient/Family education, Self Care, Joint mobilization, Joint manipulation, Stair training, Vestibular training, Canalith repositioning, DME instructions, Dry Needling, Electrical stimulation, Spinal manipulation, Spinal mobilization, Cryotherapy, Moist heat, Manual therapy, and Re-evaluation  PLAN FOR NEXT SESSION: Continue with manual therapy for pain relief and ROM of c-spine and balance training for impaired balance.    Lenda Kelp, PT 08/01/2022, 9:07 AM

## 2022-08-02 ENCOUNTER — Ambulatory Visit: Payer: Medicare Other | Admitting: Physical Therapy

## 2022-08-07 NOTE — Therapy (Unsigned)
OUTPATIENT PHYSICAL THERAPY CERVICAL and BALANCE TREATMENT   Patient Name: Billy Berg MRN: [id number] DOB:[birth year], 82 y.o., male Today's Date: 08/08/2022  END OF SESSION:  PT End of Session - 08/08/22 1115     Visit Number 3    Number of Visits 24    Date for PT Re-Evaluation 10/17/22    Progress Note Due on Visit 10    PT Start Time 1145    PT Stop Time 1227    PT Time Calculation (min) 42 min    Equipment Utilized During Treatment Gait belt    Activity Tolerance Patient tolerated treatment well    Behavior During Therapy WFL for tasks assessed/performed              Past Medical History:  Diagnosis Date   Anxiety    Arthritis    GERD (gastroesophageal reflux disease)    History of kidney stones    Hyperlipidemia    Hypertension    Sleep apnea    Wears hearing aid in both ears    Past Surgical History:  Procedure Laterality Date   CATARACT EXTRACTION W/PHACO Right 07/18/2021   Procedure: CATARACT EXTRACTION PHACO AND INTRAOCULAR LENS PLACEMENT (IOC) RIGHT;  Surgeon: Galen Manila, MD;  Location: [facility];  Service: Ophthalmology;  Laterality: Right;  sleep apnea 8.51 00:48.6   CATARACT EXTRACTION W/PHACO Left 08/01/2021   Procedure: CATARACT EXTRACTION PHACO AND INTRAOCULAR LENS PLACEMENT (IOC) LEFT 8.06 01:02.5;  Surgeon: Galen Manila, MD;  Location: [facility];  Service: Ophthalmology;  Laterality: Left;  sleep apnea   COLONOSCOPY     COLONOSCOPY WITH PROPOFOL N/A 10/14/2017   Procedure: COLONOSCOPY WITH PROPOFOL;  Surgeon: Scot Jun, MD;  Location: [facility];  Service: Endoscopy;  Laterality: N/A;   JOINT REPLACEMENT Right 04/2007   KNEE   TONSILLECTOMY     There are no problems to display for this patient.   PCP: Dr Jurell Sites  REFERRING PROVIDER: Dr. Martez Sites  REFERRING DIAG: Neck pain; Balance disorder  THERAPY DIAG:  No diagnosis found.  Rationale for Evaluation and Treatment:  Rehabilitation  ONSET DATE: Around 6 months ago (02/2022)  SUBJECTIVE:                                                                                                                                                                                                         SUBJECTIVE STATEMENT: Pt reports no significant changes since last session. Pt reports he had increased pain Thursday last week but improved pain immediately following therapy last session.  Hand dominance: Right  PERTINENT HISTORY:   Patient presents with referral to outpatient PT following appt with Dr. Graciela Husbands for annual exam- including some neck and back pain and imbalance.   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  COLONOSCOPY 12/03/2013  Four 1-5 mm adenomas, sigmoid diverticulosis, internal hemorrhoids; repeat in 11/2016.  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 Yes: NPRS scale: 3-4/10 Pain location: Left upper trap region Pain description: ache Aggravating factors: lifting/driving, turning my head Relieving factors: rest, meds, occasional use of heat  PRECAUTIONS: Fall  WEIGHT BEARING RESTRICTIONS: No  FALLS:  Has patient fallen in last 6 months? Yes. Number of falls 2  LIVING ENVIRONMENT: Lives with: lives with their spouse Lives in: House/apartment Stairs: Yes: Internal: 12 steps; can reach both Has following equipment at home: None  OCCUPATION: Retired Software engineer  PLOF: Independent  PATIENT GOALS: To have improved flexibility in my neck and improve my balance  NEXT MD VISIT:  none  OBJECTIVE:   DIAGNOSTIC FINDINGS:  None recent  PATIENT SURVEYS:  NDI 24% indicating mild disability FOTO 71  COGNITION: Overall cognitive status: Within functional limits for tasks assessed- does revert to wife at times to answer questions  SENSATION: WFL with all 4 extremities  POSTURE: rounded shoulders and forward head  PALPATION: (+) tenderness along left Upper trap region with taught bands identified   CERVICAL ROM:   Active ROM A/PROM (deg) eval  Flexion 45  Extension 68  Right lateral flexion 18*  Left lateral flexion 26*  Right rotation 75*  Left rotation 52*   (Blank rows = not tested)    UPPER EXTREMITY MMT:  MMT Right eval Left eval  Shoulder flexion 5 4  Shoulder extension NT 4  Shoulder abduction 5 4  Shoulder adduction 5 4  Shoulder internal rotation 5 4  Shoulder external rotation 5 4  Elbow flexion 5 4+  Elbow extension 5 4+   (Blank rows = not tested)  Strength R/L 4/4 Hip flexion 4/4 Hip external rotation 4/4 Hip internal rotation 4/4 Hip extension  4/4 Hip abduction 5/5 Hip adduction 4/4 Knee extension 5/5 Knee flexion NT- Ankle Plantarflexion 5/5 Ankle Dorsiflexion     CERVICAL SPECIAL TESTS:  Spurling's test: Negative   Gait - Wide base of support- decreased step length- waddle gait pattern approx 120 feet total.   FUNCTIONAL TESTS:  5 times sit to stand: 19.48 10 meter walk test: 15.10sec or 0.66 m/s Functional gait assessment: 15/30  TODAY'S TREATMENT:  DATE: 07/31/2022    Manual therapy:   -Passive ROM (Cervical SB and Rotation)  - Gentle cervical distraction 2 x 45 sec   -cervical L SL with GH depression 2 x 45 sec  -STM to Upper cervical paraspinals, levator, and Upper trap region x 15 min   - many TP noted with this and some relief reported following.   Therapeutic  Exercises:  - Seated Shoulder Rolls  - 1 sets - 10 reps,   -cues for proper form with scapular retraction portion  - Standing Shoulder Row with GTB- 3 sets - 10 reps  -instructed pt in anchoring in door so he can complete independently  - Supine Chin Tuck  - 10 reps with 5-10 sec holds  - Shoulder extension with GTB-resistance - 1 x 10 reps  -instructed pt in anchoring in door so he can complete independently   -instructed pt in log roll technique for getting in and out of bed as pt displayed discomfort with this at start of session.      PATIENT EDUCATION:  Education details: Purpose of PT; plan of care Person educated: Patient/wife Education method: Explanation, Demonstration, Tactile cues, and Verbal cues Education comprehension: verbalized understanding, returned demonstration, verbal cues required, tactile cues required, and needs further education  HOME EXERCISE PROGRAM: Access Code: [email or web address] URL: [email or web address] Date: 07/31/2022 Prepared by: Maureen Ralphs  Exercises - Seated Shoulder Rolls  - 1 x daily - 7 x weekly - 3 sets - 10 reps - Standing Shoulder Row with Anchored Resistance  - 3 x weekly - 3 sets - 10 reps - Supine Chin Tuck  - 1 x daily - 7 x weekly - 3 sets - 10 reps - Shoulder extension with resistance - Neutral  - 1 x daily - 3 x weekly - 3 sets - 10 reps     Access Code: [email or web address] URL: [email or web address] Date: 07/25/2022 Prepared by: Maureen Ralphs  Exercises - Seated Cervical Sidebending Stretch  - 2 x daily - 3 sets - 30 sec hold - Seated Cervical Rotation AROM  - 2 x daily - 3 sets - 10 reps  ASSESSMENT:  CLINICAL IMPRESSION: Patient presents for PT treatment this date. Pt had some improvement in pain immediately following therapy last date but had increased pain later in the week that he remembered with prompts from his wife. Pt continues to have significant stiffness in his cervical motion. Will  incorporate balance interventions as appropriate and pt is more compliant with cervical and postural exercises at home. Pt will continue to benefit from skilled physical therapy intervention to address impairments, improve QOL, and attain therapy goals.    OBJECTIVE IMPAIRMENTS: Abnormal gait, decreased activity tolerance, decreased balance, decreased coordination, decreased endurance, decreased mobility, difficulty walking, decreased ROM, decreased strength, hypomobility, and pain.   ACTIVITY LIMITATIONS: carrying, lifting, bending, standing, squatting, stairs, reach over head, and hygiene/grooming  PARTICIPATION LIMITATIONS: cleaning, laundry, driving, shopping, community activity, and yard work  PERSONAL FACTORS: Age and 1-2 comorbidities: anxiety, HTN  are also affecting patient's functional outcome.   REHAB POTENTIAL: Good  CLINICAL DECISION MAKING: Evolving/moderate complexity  EVALUATION COMPLEXITY: Moderate   GOALS: Goals reviewed with patient? Yes  SHORT TERM GOALS: Target date: 09/05/2022  Pt will be independent with HEP in order to improve strength and decrease back pain in order to improve pain-free function at home and work.    Baseline: EVAL- Patient with no formal HEP in place Goal status: INITIAL    LONG TERM GOALS: Target date:  10/17/2022  Pt will decrease worst neck pain as reported on NPRS by at least 2 points in order to demonstrate clinically significant reduction in back pai Baseline: EVAL = 4/10 Goal status: INITIAL  2.  Pt will demonstrate decrease in NDI by at least 19% in order to demonstrate clinically significant reduction in disability related to neck injury/pain  Baseline: Eval= 24% Goal status: INITIAL  3.  Pt will improve FOTO to target score to display perceived improvements in ability to complete ADL's.  Baseline:  Goal status: INITIAL  4.  Pt will improve FGA by at least 4 points in order to demonstrate clinically significant improvement in  balance.   Baseline: 15/30 Goal status: INITIAL  5.  Pt will decrease 5TSTS by at least 3 seconds in order to demonstrate clinically significant improvement in LE strength. Baseline: 19.48 sec  Goal status: INITIAL  6.  Pt will increase by at least 0.13 m/s in order to demonstrate clinically significant improvement in community ambulation.   Baseline: EVAL= 0.66 m/s Goal status: INITIAL  7.  Patient will present with > 5 deg improved cervical Rotation with active motion for improved ability turn his head while walking or driving.  Baseline: EVAL- Cervical Rotation L/R= 52/75 deg Goal status: INITIAL  PLAN:  PT FREQUENCY: 1-2x/week  PT DURATION: 12 weeks  PLANNED INTERVENTIONS: Therapeutic exercises, Therapeutic activity, Neuromuscular re-education, Balance training, Gait training, Patient/Family education, Self Care, Joint mobilization, Joint manipulation, Stair training, Vestibular training, Canalith repositioning, DME instructions, Dry Needling, Electrical stimulation, Spinal manipulation, Spinal mobilization, Cryotherapy, Moist heat, Manual therapy, and Re-evaluation  PLAN FOR NEXT SESSION: Continue with manual therapy for pain relief and ROM of c-spine and balance training for impaired balance.    Norman Herrlich, PT 08/08/2022, 11:15 AM

## 2022-08-08 ENCOUNTER — Encounter: Payer: Self-pay | Admitting: Physical Therapy

## 2022-08-08 ENCOUNTER — Ambulatory Visit: Payer: Medicare Other | Admitting: Physical Therapy

## 2022-08-08 DIAGNOSIS — M542 Cervicalgia: Secondary | ICD-10-CM

## 2022-08-08 DIAGNOSIS — M5382 Other specified dorsopathies, cervical region: Secondary | ICD-10-CM

## 2022-08-08 DIAGNOSIS — M6281 Muscle weakness (generalized): Secondary | ICD-10-CM

## 2022-08-13 ENCOUNTER — Ambulatory Visit: Payer: Medicare Other | Attending: Internal Medicine

## 2022-08-13 DIAGNOSIS — M6281 Muscle weakness (generalized): Secondary | ICD-10-CM | POA: Diagnosis present

## 2022-08-13 DIAGNOSIS — R2681 Unsteadiness on feet: Secondary | ICD-10-CM | POA: Diagnosis present

## 2022-08-13 DIAGNOSIS — R262 Difficulty in walking, not elsewhere classified: Secondary | ICD-10-CM | POA: Diagnosis present

## 2022-08-13 DIAGNOSIS — M25612 Stiffness of left shoulder, not elsewhere classified: Secondary | ICD-10-CM | POA: Diagnosis present

## 2022-08-13 DIAGNOSIS — M5382 Other specified dorsopathies, cervical region: Secondary | ICD-10-CM | POA: Diagnosis present

## 2022-08-13 DIAGNOSIS — R2689 Other abnormalities of gait and mobility: Secondary | ICD-10-CM | POA: Diagnosis present

## 2022-08-13 DIAGNOSIS — R269 Unspecified abnormalities of gait and mobility: Secondary | ICD-10-CM | POA: Diagnosis present

## 2022-08-13 DIAGNOSIS — M542 Cervicalgia: Secondary | ICD-10-CM | POA: Diagnosis present

## 2022-08-13 NOTE — Therapy (Signed)
OUTPATIENT PHYSICAL THERAPY CERVICAL and BALANCE TREATMENT   Patient Name: Billy Berg MRN: [id number] DOB:Jan 06, 1941, 82 y.o., male Today's Date: 08/13/2022  END OF SESSION:  PT End of Session - 08/13/22 1150     Visit Number 4    Number of Visits 24    Date for PT Re-Evaluation 10/17/22    Progress Note Due on Visit 10    PT Start Time 0937    PT Stop Time 1017    PT Time Calculation (min) 40 min    Equipment Utilized During Treatment Gait belt    Activity Tolerance Patient tolerated treatment well;Patient limited by pain    Behavior During Therapy WFL for tasks assessed/performed               Past Medical History:  Diagnosis Date   Anxiety    Arthritis    GERD (gastroesophageal reflux disease)    History of kidney stones    Hyperlipidemia    Hypertension    Sleep apnea    Wears hearing aid in both ears    Past Surgical History:  Procedure Laterality Date   CATARACT EXTRACTION W/PHACO Right 07/18/2021   Procedure: CATARACT EXTRACTION PHACO AND INTRAOCULAR LENS PLACEMENT (IOC) RIGHT;  Surgeon: Galen Manila, MD;  Location: [facility];  Service: Ophthalmology;  Laterality: Right;  sleep apnea 8.51 00:48.6   CATARACT EXTRACTION W/PHACO Left 08/01/2021   Procedure: CATARACT EXTRACTION PHACO AND INTRAOCULAR LENS PLACEMENT (IOC) LEFT 8.06 01:02.5;  Surgeon: Galen Manila, MD;  Location: [facility];  Service: Ophthalmology;  Laterality: Left;  sleep apnea   COLONOSCOPY     COLONOSCOPY WITH PROPOFOL N/A 10/14/2017   Procedure: COLONOSCOPY WITH PROPOFOL;  Surgeon: Scot Jun, MD;  Location: [facility] END[facility]COPY;  Service: Endoscopy;  Laterality: N/A;   JOINT REPLACEMENT Right 04/2007   KNEE   TONSILLECTOMY     There are no problems to display for this patient.   PCP: Dr Chesky Sites  REFERRING PROVIDER: Dr. Atlas Sites  REFERRING DIAG: Neck pain; Balance disorder  THERAPY DIAG:  Cervicalgia  Limited active range of motion (AROM) of  cervical spine on lateral flexion to left  Limited active range of motion of cervical spine on rotation to right  Rationale for Evaluation and Treatment: Rehabilitation  ONSET DATE: Around 6 months ago (02/2022)  SUBJECTIVE:  SUBJECTIVE STATEMENT: Pt presents with his daughter to appointment. Pt reports shoulder pain mostly felt on L side, but that it is not "all that bad," but is constant. Pt reports no stumbles/falls. Pt reports his "biggest thing" is "when I'm getting up out of the chair" where he feels most unsteady but reports no dizziness.  Pt rates pain 3/10   Hand dominance: Right  PERTINENT HISTORY:   Patient presents with referral to outpatient PT following appt with Dr. Graciela Husbands for annual exam- including some neck and back pain and imbalance.   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  COLONOSCOPY 12/03/2013  Four 1-5 mm adenomas, sigmoid diverticulosis, internal hemorrhoids; repeat in 11/2016.  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 Yes: NPRS scale: 3-4/10 Pain location: Left upper trap region Pain description: ache Aggravating factors: lifting/driving, turning my head Relieving factors: rest, meds, occasional use of heat  PRECAUTIONS: Fall  WEIGHT BEARING RESTRICTIONS: No  FALLS:  Has patient fallen in last 6 months? Yes. Number of falls 2  LIVING ENVIRONMENT: Lives with: lives with  their spouse Lives in: House/apartment Stairs: Yes: Internal: 12 steps; can reach both Has following equipment at home: None  OCCUPATION: Retired Software engineer  PLOF: Independent  PATIENT GOALS: To have improved flexibility in my neck and improve my balance  NEXT MD VISIT: none  OBJECTIVE:   DIAGNOSTIC FINDINGS:  None recent  PATIENT SURVEYS:  NDI 24% indicating mild disability FOTO 46  COGNITION: Overall cognitive status: Within functional limits for tasks assessed- does revert to wife at times to answer questions  SENSATION: WFL with all 4 extremities  POSTURE: rounded shoulders and forward head  PALPATION: (+) tenderness along left Upper trap region with taught bands identified   CERVICAL ROM:   Active ROM A/PROM (deg) eval  Flexion 45  Extension 68  Right lateral flexion 18*  Left lateral flexion 26*  Right rotation 75*  Left rotation 52*   (Blank rows = not tested)    UPPER EXTREMITY MMT:  MMT Right eval Left eval  Shoulder flexion 5 4  Shoulder extension NT 4  Shoulder abduction 5 4  Shoulder adduction 5 4  Shoulder internal rotation 5 4  Shoulder external rotation 5 4  Elbow flexion 5 4+  Elbow extension 5 4+   (Blank rows = not tested)  Strength R/L 4/4 Hip flexion 4/4 Hip external rotation 4/4 Hip internal rotation 4/4 Hip extension  4/4 Hip abduction 5/5 Hip adduction 4/4 Knee extension 5/5 Knee flexion NT- Ankle Plantarflexion 5/5 Ankle Dorsiflexion     CERVICAL SPECIAL TESTS:  Spurling's test: Negative   Gait - Wide base of support- decreased step length- waddle gait pattern approx 120 feet total.   FUNCTIONAL TESTS:  5 times sit to stand: 19.48 10 meter walk test: 15.10sec or 0.66 m/s Functional gait assessment: 15/30  TODAY'S TREATMENT:  DATE: 08/13/22  TE: Vitals assessed  prior to interventions: Seated: 134/59 mmHg HR 72 bpm Standing: 140/69 mmHg HR 79 bpm  Pt utilizes WBOS to stand and does exhibit some increased sway, but no LOB and no dizziness.  Manual therapy: pt on plinth, supine, bolster supporting BLE  -Passive ROM (Cervical SB and Rotation) performed each direction, pt reporting stiffness throughout  - Gentle cervical distraction 4 x 20-30 sec bouts  -cervical L and R lat flex stretch with GH depression  - long duration stretching  -STM to Upper cervical paraspinals, levator scap, and Upper trap region bilaterally x several minutes    STM and TrP release then repeated with pt in seated, focus on L side. Pt still with many TP noted throughout L shoulder musculature, UT and cervical paraspinals.  Therapeutic Exercises:   Supine chin tucks 2x10 into PT hand with tactile cuing/demo. Pt reports intervention is difficult  Seated  L UT stretch 2x30 sec  Attempted R UT stretch, however, too pain limited (felt on L side) - Seated Shoulder Rolls  - CW/CC 10x for each - challenging -Seated shrugs>shoulder depression> L UT stretch 10x -Seated physioball rollouts FWD/BCKWD within pain-free range x 2 min -Seated thoracic ext over chair 10x - feeling of tightness, cuing to perform through pain-free ROM - Seated scapular squeezes 10x    PATIENT EDUCATION:  Education details: Purpose of PT; plan of care Person educated: Patient/wife Education method: Explanation, Demonstration, Tactile cues, and Verbal cues Education comprehension: verbalized understanding, returned demonstration, verbal cues required, tactile cues required, and needs further education  HOME EXERCISE PROGRAM: Access Code: [email or web address] URL: [email or web address] Date: 07/31/2022 Prepared by: Maureen Ralphs  Exercises - Seated Shoulder Rolls  - 1 x daily - 7 x weekly - 3 sets - 10 reps - Standing Shoulder Row with Anchored Resistance  - 3 x weekly - 3 sets - 10 reps -  Supine Chin Tuck  - 1 x daily - 7 x weekly - 3 sets - 10 reps - Shoulder extension with resistance - Neutral  - 1 x daily - 3 x weekly - 3 sets - 10 reps     Access Code: [email or web address] URL: [email or web address] Date: 07/25/2022 Prepared by: Maureen Ralphs  Exercises - Seated Cervical Sidebending Stretch  - 2 x daily - 3 sets - 30 sec hold - Seated Cervical Rotation AROM  - 2 x daily - 3 sets - 10 reps  ASSESSMENT:  CLINICAL IMPRESSION: Continued focus on addressing cervical, shoulder pain and mobility impairment per pt request. Pt exhibits and reports cervical stiffness, particularly on L side. He required cuing throughout to complete interventions in modified, pain-free ranges, but did exhibit significant limitations with cervical ROM. The pt continues to exhibit multiple TrPs in L UT and L cervical paraspinals.  The pt will continue to benefit from skilled physical therapy intervention to address impairments, improve QOL, and attain therapy goals.    OBJECTIVE IMPAIRMENTS: Abnormal gait, decreased activity tolerance, decreased balance, decreased coordination, decreased endurance, decreased mobility, difficulty walking, decreased ROM, decreased strength, hypomobility, and pain.   ACTIVITY LIMITATIONS: carrying, lifting, bending, standing, squatting, stairs, reach over head, and hygiene/grooming  PARTICIPATION LIMITATIONS: cleaning, laundry, driving, shopping, community activity, and yard work  PERSONAL FACTORS: Age and 1-2 comorbidities: anxiety, HTN  are also affecting patient's functional outcome.   REHAB POTENTIAL: Good  CLINICAL DECISION MAKING: Evolving/moderate complexity  EVALUATION COMPLEXITY: Moderate   GOALS: Goals reviewed with patient? Yes  SHORT TERM GOALS: Target date: 09/05/2022  Pt  will be independent with HEP in order to improve strength and decrease back pain in order to improve pain-free function at home and work.    Baseline: EVAL- Patient  with no formal HEP in place Goal status: INITIAL    LONG TERM GOALS: Target date: 10/17/2022  Pt will decrease worst neck pain as reported on NPRS by at least 2 points in order to demonstrate clinically significant reduction in back pai Baseline: EVAL = 4/10 Goal status: INITIAL  2.  Pt will demonstrate decrease in NDI by at least 19% in order to demonstrate clinically significant reduction in disability related to neck injury/pain  Baseline: Eval= 24% Goal status: INITIAL  3.  Pt will improve FOTO to target score to display perceived improvements in ability to complete ADL's.  Baseline:  Goal status: INITIAL  4.  Pt will improve FGA by at least 4 points in order to demonstrate clinically significant improvement in balance.   Baseline: 15/30 Goal status: INITIAL  5.  Pt will decrease 5TSTS by at least 3 seconds in order to demonstrate clinically significant improvement in LE strength. Baseline: 19.48 sec  Goal status: INITIAL  6.  Pt will increase by at least 0.13 m/s in order to demonstrate clinically significant improvement in community ambulation.   Baseline: EVAL= 0.66 m/s Goal status: INITIAL  7.  Patient will present with > 5 deg improved cervical Rotation with active motion for improved ability turn his head while walking or driving.  Baseline: EVAL- Cervical Rotation L/R= 52/75 deg Goal status: INITIAL  PLAN:  PT FREQUENCY: 1-2x/week  PT DURATION: 12 weeks  PLANNED INTERVENTIONS: Therapeutic exercises, Therapeutic activity, Neuromuscular re-education, Balance training, Gait training, Patient/Family education, Self Care, Joint mobilization, Joint manipulation, Stair training, Vestibular training, Canalith repositioning, DME instructions, Dry Needling, Electrical stimulation, Spinal manipulation, Spinal mobilization, Cryotherapy, Moist heat, Manual therapy, and Re-evaluation  PLAN FOR NEXT SESSION: Continue with manual therapy for pain relief and ROM of c-spine  and balance training for impaired balance.    Baird Kay, PT 08/13/2022, 11:59 AM

## 2022-08-15 ENCOUNTER — Ambulatory Visit: Payer: Medicare Other | Admitting: Physical Therapy

## 2022-08-15 ENCOUNTER — Encounter: Payer: Self-pay | Admitting: Physical Therapy

## 2022-08-15 DIAGNOSIS — R2689 Other abnormalities of gait and mobility: Secondary | ICD-10-CM

## 2022-08-15 DIAGNOSIS — M6281 Muscle weakness (generalized): Secondary | ICD-10-CM

## 2022-08-15 DIAGNOSIS — R262 Difficulty in walking, not elsewhere classified: Secondary | ICD-10-CM

## 2022-08-15 DIAGNOSIS — R269 Unspecified abnormalities of gait and mobility: Secondary | ICD-10-CM

## 2022-08-15 DIAGNOSIS — M5382 Other specified dorsopathies, cervical region: Secondary | ICD-10-CM

## 2022-08-15 DIAGNOSIS — M542 Cervicalgia: Secondary | ICD-10-CM | POA: Diagnosis not present

## 2022-08-15 NOTE — Therapy (Signed)
OUTPATIENT PHYSICAL THERAPY CERVICAL and BALANCE TREATMENT   Patient Name: Billy Berg MRN: [id number] DOB:06-04-1940, 82 y.o., male Today's Date: 08/15/2022  END OF SESSION:  PT End of Session - 08/15/22 1022     Visit Number 5    Number of Visits 24    Date for PT Re-Evaluation 10/17/22    Progress Note Due on Visit 10    PT Start Time 1020    PT Stop Time 1100    PT Time Calculation (min) 40 min    Equipment Utilized During Treatment Gait belt    Activity Tolerance Patient tolerated treatment well;Patient limited by pain    Behavior During Therapy WFL for tasks assessed/performed                Past Medical History:  Diagnosis Date   Anxiety    Arthritis    GERD (gastroesophageal reflux disease)    History of kidney stones    Hyperlipidemia    Hypertension    Sleep apnea    Wears hearing aid in both ears    Past Surgical History:  Procedure Laterality Date   CATARACT EXTRACTION W/PHACO Right 07/18/2021   Procedure: CATARACT EXTRACTION PHACO AND INTRAOCULAR LENS PLACEMENT (IOC) RIGHT;  Surgeon: Galen Manila, MD;  Location: [facility];  Service: Ophthalmology;  Laterality: Right;  sleep apnea 8.51 00:48.6   CATARACT EXTRACTION W/PHACO Left 08/01/2021   Procedure: CATARACT EXTRACTION PHACO AND INTRAOCULAR LENS PLACEMENT (IOC) LEFT 8.06 01:02.5;  Surgeon: Galen Manila, MD;  Location: [facility];  Service: Ophthalmology;  Laterality: Left;  sleep apnea   COLONOSCOPY     COLONOSCOPY WITH PROPOFOL N/A 10/14/2017   Procedure: COLONOSCOPY WITH PROPOFOL;  Surgeon: Scot Jun, MD;  Location: [facility];  Service: Endoscopy;  Laterality: N/A;   JOINT REPLACEMENT Right 04/2007   KNEE   TONSILLECTOMY     There are no problems to display for this patient.   PCP: Dr Marjorie Sites  REFERRING PROVIDER: Dr. Mischa Sites  REFERRING DIAG: Neck pain; Balance disorder  THERAPY DIAG:  Cervicalgia  Limited active range of motion of  cervical spine on rotation to right  Limited active range of motion (AROM) of cervical spine on lateral flexion to left  Imbalance  Muscle weakness (generalized)  Abnormality of gait and mobility  Difficulty in walking, not elsewhere classified  Rationale for Evaluation and Treatment: Rehabilitation  ONSET DATE: Around 6 months ago (02/2022)  SUBJECTIVE:  SUBJECTIVE STATEMENT: Pt presents with wife to appt, requests longer green TB for over the door exercises and this was provided. No falls since last visit.   Hand dominance: Right  PERTINENT HISTORY:   Patient presents with referral to outpatient PT following appt with Dr. Graciela Husbands for annual exam- including some neck and back pain and imbalance.   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  COLONOSCOPY 12/03/2013  Four 1-5 mm adenomas, sigmoid diverticulosis, internal hemorrhoids; repeat in 11/2016.  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 Yes: NPRS scale: 3-4/10 Pain location: Left upper trap region Pain description: ache Aggravating factors: lifting/driving, turning my head Relieving factors: rest, meds, occasional use of heat  PRECAUTIONS: Fall  WEIGHT BEARING RESTRICTIONS: No  FALLS:  Has patient fallen in last 6 months? Yes. Number of falls 2  LIVING ENVIRONMENT: Lives with: lives with their spouse Lives in: House/apartment Stairs: Yes:  Internal: 12 steps; can reach both Has following equipment at home: None  OCCUPATION: Retired Software engineer  PLOF: Independent  PATIENT GOALS: To have improved flexibility in my neck and improve my balance  NEXT MD VISIT: none  OBJECTIVE:   DIAGNOSTIC FINDINGS:  None recent  PATIENT SURVEYS:  NDI 24% indicating mild disability FOTO 15  COGNITION: Overall cognitive status: Within functional limits for tasks assessed- does revert to wife at times to answer questions  SENSATION: WFL with all 4 extremities  POSTURE: rounded shoulders and forward head  PALPATION: (+) tenderness along left Upper trap region with taught bands identified   CERVICAL ROM:   Active ROM A/PROM (deg) eval  Flexion 45  Extension 68  Right lateral flexion 18*  Left lateral flexion 26*  Right rotation 75*  Left rotation 52*   (Blank rows = not tested)    UPPER EXTREMITY MMT:  MMT Right eval Left eval  Shoulder flexion 5 4  Shoulder extension NT 4  Shoulder abduction 5 4  Shoulder adduction 5 4  Shoulder internal rotation 5 4  Shoulder external rotation 5 4  Elbow flexion 5 4+  Elbow extension 5 4+   (Blank rows = not tested)  Strength R/L 4/4 Hip flexion 4/4 Hip external rotation 4/4 Hip internal rotation 4/4 Hip extension  4/4 Hip abduction 5/5 Hip adduction 4/4 Knee extension 5/5 Knee flexion NT- Ankle Plantarflexion 5/5 Ankle Dorsiflexion     CERVICAL SPECIAL TESTS:  Spurling's test: Negative   Gait - Wide base of support- decreased step length- waddle gait pattern approx 120 feet total.   FUNCTIONAL TESTS:  5 times sit to stand: 19.48 10 meter walk test: 15.10sec or 0.66 m/s Functional gait assessment: 15/30  TODAY'S TREATMENT:  DATE: 08/15/22   Exercise/Activity Sets/ Reps/Time/ Resistance Assistance Charge type  Comments  NBOS on airex with head turns  2 x 45 sec  Neuro re-ed Good use of ankle righting strategies.  Semitandem on airex  2 x 45 sec  Neuro re-ed Cues for correct foot positioning and good use of ankle strategies for maintenance of balance.  Marching 10 x each  Neuro re-ed Cues for slow and controlled marching   Airex step up lateral  10 x ea   Neuro re-ed Good step pattern.   LAQ  2 x 10 with 5# AW   TE   Airex ball toss  2 x 15 tosses   NMR    Rows and shoulder extensions in seated  2 x 10 reps with GTB for each   TE Cues for proper form  STS  2 x 10 reps   TE          Treatment provided this session   Pt educated throughout session about proper posture and technique with exercises. Improved exercise technique, movement at target joints, use of target muscles after min to mod verbal, visual, tactile cues. Note: Portions of this document were prepared using Dragon voice recognition software and although reviewed may contain unintentional dictation errors in syntax, grammar, or spelling.    PATIENT EDUCATION:  Education details: Purpose of PT; plan of care Person educated: Patient/wife Education method: Explanation, Demonstration, Tactile cues, and Verbal cues Education comprehension: verbalized understanding, returned demonstration, verbal cues required, tactile cues required, and needs further education  HOME EXERCISE PROGRAM: Access Code: [email or web address] URL: [email or web address] Date: 07/31/2022 Prepared by: Maureen Ralphs  Exercises - Seated Shoulder Rolls  - 1 x daily - 7 x weekly - 3 sets - 10 reps - Standing Shoulder Row with Anchored Resistance  - 3 x weekly - 3 sets - 10 reps - Supine Chin Tuck  - 1 x daily - 7 x weekly - 3 sets - 10 reps - Shoulder extension with resistance - Neutral  - 1 x daily - 3 x weekly - 3 sets - 10 reps     Access Code: [email or web address] URL: [email or web address] Date: 07/25/2022 Prepared by: Maureen Ralphs  Exercises - Seated Cervical Sidebending Stretch  - 2 x daily - 3 sets - 30 sec hold - Seated Cervical Rotation AROM  - 2 x daily - 3 sets - 10 reps  ASSESSMENT:  CLINICAL IMPRESSION:  Patient began with balance interventions this date as his shoulder and neck were at a reasonable level at the beginning of the session.  Some neck and postural exercises were incorporated into today's session as well for primary focus was balance and lower extremity strength per plan of care.  Pt remains motivated to advance progress toward goals in order to maximize independence and safety at home. Pt requires high level assistance and cuing for completion of exercises in order to provide adequate level of stimulation and perturbation. Author allows pt as much opportunity as possible to perform independent righting strategies, only stepping in when pt is unable to prevent falling to floor. Pt closely monitored throughout session for safe vitals response and to maximize patient safety during interventions. Pt continues to demonstrate progress toward goals AEB progression of some interventions this date either in volume or intensity.    OBJECTIVE IMPAIRMENTS: Abnormal gait, decreased activity tolerance, decreased balance, decreased coordination, decreased endurance, decreased mobility, difficulty walking, decreased ROM, decreased strength, hypomobility, and pain.   ACTIVITY LIMITATIONS: carrying, lifting, bending, standing,  squatting, stairs, reach over head, and hygiene/grooming  PARTICIPATION LIMITATIONS: cleaning, laundry, driving, shopping, community activity, and yard work  PERSONAL FACTORS: Age and 1-2 comorbidities: anxiety, HTN  are also affecting patient's functional outcome.   REHAB POTENTIAL: Good  CLINICAL DECISION MAKING: Evolving/moderate complexity  EVALUATION COMPLEXITY: Moderate   GOALS: Goals reviewed with patient? Yes  SHORT TERM GOALS: Target date: 09/05/2022  Pt will be  independent with HEP in order to improve strength and decrease back pain in order to improve pain-free function at home and work.    Baseline: EVAL- Patient with no formal HEP in place Goal status: INITIAL    LONG TERM GOALS: Target date: 10/17/2022  Pt will decrease worst neck pain as reported on NPRS by at least 2 points in order to demonstrate clinically significant reduction in back pai Baseline: EVAL = 4/10 Goal status: INITIAL  2.  Pt will demonstrate decrease in NDI by at least 19% in order to demonstrate clinically significant reduction in disability related to neck injury/pain  Baseline: Eval= 24% Goal status: INITIAL  3.  Pt will improve FOTO to target score to display perceived improvements in ability to complete ADL's.  Baseline:  Goal status: INITIAL  4.  Pt will improve FGA by at least 4 points in order to demonstrate clinically significant improvement in balance.   Baseline: 15/30 Goal status: INITIAL  5.  Pt will decrease 5TSTS by at least 3 seconds in order to demonstrate clinically significant improvement in LE strength. Baseline: 19.48 sec  Goal status: INITIAL  6.  Pt will increase by at least 0.13 m/s in order to demonstrate clinically significant improvement in community ambulation.   Baseline: EVAL= 0.66 m/s Goal status: INITIAL  7.  Patient will present with > 5 deg improved cervical Rotation with active motion for improved ability turn his head while walking or driving.  Baseline: EVAL- Cervical Rotation L/R= 52/75 deg Goal status: INITIAL  PLAN:  PT FREQUENCY: 1-2x/week  PT DURATION: 12 weeks  PLANNED INTERVENTIONS: Therapeutic exercises, Therapeutic activity, Neuromuscular re-education, Balance training, Gait training, Patient/Family education, Self Care, Joint mobilization, Joint manipulation, Stair training, Vestibular training, Canalith repositioning, DME instructions, Dry Needling, Electrical stimulation, Spinal manipulation, Spinal  mobilization, Cryotherapy, Moist heat, Manual therapy, and Re-evaluation  PLAN FOR NEXT SESSION: Continue with manual therapy for pain relief and ROM of c-spine and balance training for impaired balance.    Norman Herrlich, PT 08/15/2022, 10:24 AM

## 2022-08-16 ENCOUNTER — Ambulatory Visit: Payer: Medicare Other

## 2022-08-21 ENCOUNTER — Ambulatory Visit: Payer: Medicare Other

## 2022-08-21 DIAGNOSIS — M542 Cervicalgia: Secondary | ICD-10-CM

## 2022-08-21 DIAGNOSIS — M6281 Muscle weakness (generalized): Secondary | ICD-10-CM

## 2022-08-21 DIAGNOSIS — R2681 Unsteadiness on feet: Secondary | ICD-10-CM

## 2022-08-21 NOTE — Therapy (Signed)
OUTPATIENT PHYSICAL THERAPY CERVICAL and BALANCE TREATMENT   Patient Name: Billy Berg MRN: [id number] DOB:Mar 12, 1941, 82 y.o., male Today's Date: 08/21/2022  END OF SESSION:  PT End of Session - 08/21/22 1604     Visit Number 6    Number of Visits 24    Date for PT Re-Evaluation 10/17/22    Progress Note Due on Visit 10    PT Start Time 1516    PT Stop Time 1559    PT Time Calculation (min) 43 min    Equipment Utilized During Treatment Gait belt    Activity Tolerance Patient tolerated treatment well    Behavior During Therapy WFL for tasks assessed/performed                Past Medical History:  Diagnosis Date   Anxiety    Arthritis    GERD (gastroesophageal reflux disease)    History of kidney stones    Hyperlipidemia    Hypertension    Sleep apnea    Wears hearing aid in both ears    Past Surgical History:  Procedure Laterality Date   CATARACT EXTRACTION W/PHACO Right 07/18/2021   Procedure: CATARACT EXTRACTION PHACO AND INTRAOCULAR LENS PLACEMENT (IOC) RIGHT;  Surgeon: Galen Manila, MD;  Location: [facility];  Service: Ophthalmology;  Laterality: Right;  sleep apnea 8.51 00:48.6   CATARACT EXTRACTION W/PHACO Left 08/01/2021   Procedure: CATARACT EXTRACTION PHACO AND INTRAOCULAR LENS PLACEMENT (IOC) LEFT 8.06 01:02.5;  Surgeon: Galen Manila, MD;  Location: [facility];  Service: Ophthalmology;  Laterality: Left;  sleep apnea   COLONOSCOPY     COLONOSCOPY WITH PROPOFOL N/A 10/14/2017   Procedure: COLONOSCOPY WITH PROPOFOL;  Surgeon: Scot Jun, MD;  Location: [facility] END[facility]COPY;  Service: Endoscopy;  Laterality: N/A;   JOINT REPLACEMENT Right 04/2007   KNEE   TONSILLECTOMY     There are no problems to display for this patient.   PCP: Dr Aidian Sites  REFERRING PROVIDER: Dr. Daryon Sites  REFERRING DIAG: Neck pain; Balance disorder  THERAPY DIAG:  Unsteadiness on feet  Muscle weakness  (generalized)  Cervicalgia  Rationale for Evaluation and Treatment: Rehabilitation  ONSET DATE: Around 6 months ago (02/2022)  SUBJECTIVE:                                                                                                                                                                                                         SUBJECTIVE STATEMENT: Pt presents with wife to appt. Pt wife reports pt hasn't been feeling well, pt rates neck  pain 4/10. Pt spouse reports no stumbles/falls.   Hand dominance: Right  PERTINENT HISTORY:   Patient presents with referral to outpatient PT following appt with Dr. Graciela Husbands for annual exam- including some neck and back pain and imbalance.   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  COLONOSCOPY 12/03/2013  Four 1-5 mm adenomas, sigmoid diverticulosis, internal hemorrhoids; repeat in 11/2016.  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 Yes: NPRS scale: 3-4/10 Pain location: Left upper trap region Pain description: ache Aggravating factors: lifting/driving, turning my head Relieving factors: rest, meds, occasional use of heat  PRECAUTIONS: Fall  WEIGHT BEARING RESTRICTIONS: No  FALLS:  Has patient fallen in last 6 months? Yes. Number of falls 2  LIVING ENVIRONMENT: Lives with: lives with their spouse Lives in: House/apartment Stairs: Yes: Internal: 12 steps; can reach both Has following equipment at home: None  OCCUPATION: Retired Software engineer  PLOF: Independent  PATIENT GOALS: To have improved flexibility in my neck and improve my  balance  NEXT MD VISIT: none  OBJECTIVE:   DIAGNOSTIC FINDINGS:  None recent  PATIENT SURVEYS:  NDI 24% indicating mild disability FOTO 35  COGNITION: Overall cognitive status: Within functional limits for tasks assessed- does revert to wife at times to answer questions  SENSATION: WFL with all 4 extremities  POSTURE: rounded shoulders and forward head  PALPATION: (+) tenderness along left Upper trap region with taught bands identified   CERVICAL ROM:   Active ROM A/PROM (deg) eval  Flexion 45  Extension 68  Right lateral flexion 18*  Left lateral flexion 26*  Right rotation 75*  Left rotation 52*   (Blank rows = not tested)    UPPER EXTREMITY MMT:  MMT Right eval Left eval  Shoulder flexion 5 4  Shoulder extension NT 4  Shoulder abduction 5 4  Shoulder adduction 5 4  Shoulder internal rotation 5 4  Shoulder external rotation 5 4  Elbow flexion 5 4+  Elbow extension 5 4+   (Blank rows = not tested)  Strength R/L 4/4 Hip flexion 4/4 Hip external rotation 4/4 Hip internal rotation 4/4 Hip extension  4/4 Hip abduction 5/5 Hip adduction 4/4 Knee extension 5/5 Knee flexion NT- Ankle Plantarflexion 5/5 Ankle Dorsiflexion     CERVICAL SPECIAL TESTS:  Spurling's test: Negative   Gait - Wide base of support- decreased step length- waddle gait pattern approx 120 feet total.   FUNCTIONAL TESTS:  5 times sit to stand: 19.48 10 meter walk test: 15.10sec or 0.66 m/s Functional gait assessment: 15/30  TODAY'S TREATMENT:  DATE: 08/21/22   Exercise/Activity Sets/ Reps/Time/ Resistance Assistance Charge type Comments  NBOS on airex with head turns  2 x 10-12 reps each way for vertical and horizontal movement  Neuro re-ed Good use of ankle righting strategies.  Semitandem on airex  2 x 45 sec  Neuro re-ed Rates difficult. Pt  requires at least 2 finger support on bar to maintain balance.  Marching 10 x each on firm surface  Then 2x10 each LE on airex  Neuro re-ed Continued cues for slow and controlled marching   Airex step up lateral  X multiple reps each way  Neuro re-ed Cuing for step pattern (pt takes multiple small steps initially)  LAQ  1 x 10, 1x12 with 5# AW   TE Pt rates medium  NBOS EC  2 x  30 sec   NMR    Standing hip abduction  2x10  TE   STS  2 x 10, 1x5 reps   TE   Pt rates medium  Seated march with 5# AW each LE 2x12 each LE  TE Pt rates medium  Treatment provided this session     PATIENT EDUCATION:  Education details: Pt educated throughout session about proper posture and technique with exercises. Improved exercise technique, movement at target joints, use of target muscles after min to mod verbal, visual, tactile cues.  Person educated: Patient/wife Education method: Explanation, Demonstration, Tactile cues, and Verbal cues Education comprehension: verbalized understanding, returned demonstration, verbal cues required, tactile cues required, and needs further education  HOME EXERCISE PROGRAM: Access Code: [email or web address] URL: [email or web address] Date: 07/31/2022 Prepared by: Maureen Ralphs  Exercises - Seated Shoulder Rolls  - 1 x daily - 7 x weekly - 3 sets - 10 reps - Standing Shoulder Row with Anchored Resistance  - 3 x weekly - 3 sets - 10 reps - Supine Chin Tuck  - 1 x daily - 7 x weekly - 3 sets - 10 reps - Shoulder extension with resistance - Neutral  - 1 x daily - 3 x weekly - 3 sets - 10 reps     Access Code: [email or web address] URL: [email or web address] Date: 07/25/2022 Prepared by: Maureen Ralphs  Exercises - Seated Cervical Sidebending Stretch  - 2 x daily - 3 sets - 30 sec hold - Seated Cervical Rotation AROM  - 2 x daily - 3 sets - 10 reps  ASSESSMENT:  CLINICAL IMPRESSION: PT continued plan as laid out in previous session. Focus was on  balance and LE strengthening interventions. Pt was able to advance majority of strengthening exercises in terms of increasing reps completed. Pt rated majority of interventions as a moderate challenge.The pt will benefit from further skilled PT to improve strength, balance, pain and mobility.   OBJECTIVE IMPAIRMENTS: Abnormal gait, decreased activity tolerance, decreased balance, decreased coordination, decreased endurance, decreased mobility, difficulty walking, decreased ROM, decreased strength, hypomobility, and pain.   ACTIVITY LIMITATIONS: carrying, lifting, bending, standing, squatting, stairs, reach over head, and hygiene/grooming  PARTICIPATION LIMITATIONS: cleaning, laundry, driving, shopping, community activity, and yard work  PERSONAL FACTORS: Age and 1-2 comorbidities: anxiety, HTN  are also affecting patient's functional outcome.   REHAB POTENTIAL: Good  CLINICAL DECISION MAKING: Evolving/moderate complexity  EVALUATION COMPLEXITY: Moderate   GOALS: Goals reviewed with patient? Yes  SHORT TERM GOALS: Target date: 09/05/2022  Pt will be independent with HEP in order to improve strength and decrease back pain in order to improve pain-free function at home and work.    Baseline: EVAL- Patient with no  formal HEP in place Goal status: INITIAL    LONG TERM GOALS: Target date: 10/17/2022  Pt will decrease worst neck pain as reported on NPRS by at least 2 points in order to demonstrate clinically significant reduction in back pai Baseline: EVAL = 4/10 Goal status: INITIAL  2.  Pt will demonstrate decrease in NDI by at least 19% in order to demonstrate clinically significant reduction in disability related to neck injury/pain  Baseline: Eval= 24% Goal status: INITIAL  3.  Pt will improve FOTO to target score to display perceived improvements in ability to complete ADL's.  Baseline:  Goal status: INITIAL  4.  Pt will improve FGA by at least 4 points in order to demonstrate  clinically significant improvement in balance.   Baseline: 15/30 Goal status: INITIAL  5.  Pt will decrease 5TSTS by at least 3 seconds in order to demonstrate clinically significant improvement in LE strength. Baseline: 19.48 sec  Goal status: INITIAL  6.  Pt will increase by at least 0.13 m/s in order to demonstrate clinically significant improvement in community ambulation.   Baseline: EVAL= 0.66 m/s Goal status: INITIAL  7.  Patient will present with > 5 deg improved cervical Rotation with active motion for improved ability turn his head while walking or driving.  Baseline: EVAL- Cervical Rotation L/R= 52/75 deg Goal status: INITIAL  PLAN:  PT FREQUENCY: 1-2x/week  PT DURATION: 12 weeks  PLANNED INTERVENTIONS: Therapeutic exercises, Therapeutic activity, Neuromuscular re-education, Balance training, Gait training, Patient/Family education, Self Care, Joint mobilization, Joint manipulation, Stair training, Vestibular training, Canalith repositioning, DME instructions, Dry Needling, Electrical stimulation, Spinal manipulation, Spinal mobilization, Cryotherapy, Moist heat, Manual therapy, and Re-evaluation  PLAN FOR NEXT SESSION: Continue with manual therapy for pain relief and ROM of c-spine and balance training for impaired balance.    Temple Pacini PT, DPT 08/21/2022, 4:05 PM

## 2022-08-23 ENCOUNTER — Ambulatory Visit: Payer: Medicare Other

## 2022-08-23 DIAGNOSIS — R2681 Unsteadiness on feet: Secondary | ICD-10-CM

## 2022-08-23 DIAGNOSIS — M542 Cervicalgia: Secondary | ICD-10-CM

## 2022-08-23 DIAGNOSIS — M5382 Other specified dorsopathies, cervical region: Secondary | ICD-10-CM

## 2022-08-23 NOTE — Therapy (Signed)
OUTPATIENT PHYSICAL THERAPY CERVICAL and BALANCE TREATMENT   Patient Name: Billy Berg MRN: [id number] DOB:[birth year], 82 y.o., male Today's Date: 08/24/2022  END OF SESSION:  PT End of Session - 08/23/22 1348     Visit Number 7    Number of Visits 24    Date for PT Re-Evaluation 10/17/22    Progress Note Due on Visit 10    PT Start Time 1349    PT Stop Time 1428    PT Time Calculation (min) 39 min    Equipment Utilized During Treatment Gait belt    Activity Tolerance Patient tolerated treatment well    Behavior During Therapy WFL for tasks assessed/performed                Past Medical History:  Diagnosis Date   Anxiety    Arthritis    GERD (gastroesophageal reflux disease)    History of kidney stones    Hyperlipidemia    Hypertension    Sleep apnea    Wears hearing aid in both ears    Past Surgical History:  Procedure Laterality Date   CATARACT EXTRACTION W/PHACO Right 07/18/2021   Procedure: CATARACT EXTRACTION PHACO AND INTRAOCULAR LENS PLACEMENT (IOC) RIGHT;  Surgeon: Galen Manila, MD;  Location: [facility];  Service: Ophthalmology;  Laterality: Right;  sleep apnea 8.51 00:48.6   CATARACT EXTRACTION W/PHACO Left 08/01/2021   Procedure: CATARACT EXTRACTION PHACO AND INTRAOCULAR LENS PLACEMENT (IOC) LEFT 8.06 01:02.5;  Surgeon: Galen Manila, MD;  Location: [facility];  Service: Ophthalmology;  Laterality: Left;  sleep apnea   COLONOSCOPY     COLONOSCOPY WITH PROPOFOL N/A 10/14/2017   Procedure: COLONOSCOPY WITH PROPOFOL;  Surgeon: Scot Jun, MD;  Location: [facility];  Service: Endoscopy;  Laterality: N/A;   JOINT REPLACEMENT Right 04/2007   KNEE   TONSILLECTOMY     There are no problems to display for this patient.   PCP: Dr Aj Sites  REFERRING PROVIDER: Dr. Dayvon Sites  REFERRING DIAG: Neck pain; Balance disorder  THERAPY DIAG:  Cervicalgia  Unsteadiness on feet  Limited active range of motion of  cervical spine on rotation to right  Limited active range of motion (AROM) of cervical spine on lateral flexion to left  Rationale for Evaluation and Treatment: Rehabilitation  ONSET DATE: Around 6 months ago (02/2022)  SUBJECTIVE:  SUBJECTIVE STATEMENT: Pt presents with wife to appt. Pt rates neck pain level as 2/10. Pt reports he was not sore in his BLE following last visit. Pt spouse reports no stumbles/falls as pt reports he is unsure/cannot remember.  Hand dominance: Right  PERTINENT HISTORY:   Patient presents with referral to outpatient PT following appt with Dr. Graciela Husbands for annual exam- including some neck and back pain and imbalance.   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  COLONOSCOPY 12/03/2013  Four 1-5 mm adenomas, sigmoid diverticulosis, internal hemorrhoids; repeat in 11/2016.  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 Yes: NPRS scale: 3-4/10 Pain location: Left upper trap region Pain description: ache Aggravating factors: lifting/driving, turning my head Relieving factors: rest, meds, occasional use of heat  PRECAUTIONS: Fall  WEIGHT BEARING RESTRICTIONS: No  FALLS:  Has patient fallen in last 6 months? Yes. Number of falls 2  LIVING ENVIRONMENT: Lives with: lives with their spouse Lives in: House/apartment Stairs: Yes: Internal: 12 steps; can reach both Has following equipment at  home: None  OCCUPATION: Retired Software engineer  PLOF: Independent  PATIENT GOALS: To have improved flexibility in my neck and improve my balance  NEXT MD VISIT: none  OBJECTIVE:   DIAGNOSTIC FINDINGS:  None recent  PATIENT SURVEYS:  NDI 24% indicating mild disability FOTO 30  COGNITION: Overall cognitive status: Within functional limits for tasks assessed- does revert to wife at times to answer questions  SENSATION: WFL with all 4 extremities  POSTURE: rounded shoulders and forward head  PALPATION: (+) tenderness along left Upper trap region with taught bands identified   CERVICAL ROM:   Active ROM A/PROM (deg) eval  Flexion 45  Extension 68  Right lateral flexion 18*  Left lateral flexion 26*  Right rotation 75*  Left rotation 52*   (Blank rows = not tested)    UPPER EXTREMITY MMT:  MMT Right eval Left eval  Shoulder flexion 5 4  Shoulder extension NT 4  Shoulder abduction 5 4  Shoulder adduction 5 4  Shoulder internal rotation 5 4  Shoulder external rotation 5 4  Elbow flexion 5 4+  Elbow extension 5 4+   (Blank rows = not tested)  Strength R/L 4/4 Hip flexion 4/4 Hip external rotation 4/4 Hip internal rotation 4/4 Hip extension  4/4 Hip abduction 5/5 Hip adduction 4/4 Knee extension 5/5 Knee flexion NT- Ankle Plantarflexion 5/5 Ankle Dorsiflexion     CERVICAL SPECIAL TESTS:  Spurling's test: Negative   Gait - Wide base of support- decreased step length- waddle gait pattern approx 120 feet total.   FUNCTIONAL TESTS:  5 times sit to stand: 19.48 10 meter walk test: 15.10sec or 0.66 m/s Functional gait assessment: 15/30  TODAY'S TREATMENT:  DATE:   Manual Therapy: Pt seated PT provides instrument assisted STM and TrP release to bilat shoulder mm, bilat UTs, bilat levator, and cervical  paraspinals. 1 TrP in each UT. Pt responded well to TrP release. PT also assisted pt through long-duration stretching for cervical rotation and lateral flexion.  NMR: Gait belt donned and CGA provided unless specified otherwise. Interventions completed at support surface On airex: Semi-tandem 2x30 sec each LE. BLE tremulous  NBOS with vertical and horizontal head turns 10x for each --pt then repeats second set of horizontal head turns 10x as this was more challenging Alt LE march 2x20. Cuing throughout for decreased speed. Pt rates difficult One foot on floor, one foot on 6" step 2x30 sec each LE --Pt then pt repeats position with horizontal head turns 10x for each  One foot on floor, one on 6" step with EC 2x30 sec each LE Semi-tandem gait over 6 ft 1x6. Pt has significant difficulty with technique, very challenging and must use WBOS and intermittent UE support  PATIENT EDUCATION:  Education details: Pt educated throughout session about proper posture and technique with exercises. Improved exercise technique, movement at target joints, use of target muscles after min to mod verbal, visual, tactile cues.  Person educated: Patient/wife Education method: Explanation, Demonstration, Tactile cues, and Verbal cues Education comprehension: verbalized understanding, returned demonstration, verbal cues required, tactile cues required, and needs further education  HOME EXERCISE PROGRAM: Access Code: [email or web address] URL: [email or web address] Date: 07/31/2022 Prepared by: Maureen Ralphs  Exercises - Seated Shoulder Rolls  - 1 x daily - 7 x weekly - 3 sets - 10 reps - Standing Shoulder Row with Anchored Resistance  - 3 x weekly - 3 sets - 10 reps - Supine Chin Tuck  - 1 x daily - 7 x weekly - 3 sets - 10 reps - Shoulder extension with resistance - Neutral  - 1 x daily - 3 x weekly - 3 sets - 10 reps     Access Code: [email or web address] URL: [email or web address] Date:  07/25/2022 Prepared by: Maureen Ralphs  Exercises - Seated Cervical Sidebending Stretch  - 2 x daily - 3 sets - 30 sec hold - Seated Cervical Rotation AROM  - 2 x daily - 3 sets - 10 reps  ASSESSMENT:  CLINICAL IMPRESSION: Beginning of session focused on manual therapy for pain modulation. Pt did report slight improvement with intervention. Pt then completed multiple balance interventions and was noted to have the greatest difficulty with movement utilizing horizontal head turns and with semi-tandem gait.The pt will benefit from further skilled PT to improve strength, balance, pain and mobility.   OBJECTIVE IMPAIRMENTS: Abnormal gait, decreased activity tolerance, decreased balance, decreased coordination, decreased endurance, decreased mobility, difficulty walking, decreased ROM, decreased strength, hypomobility, and pain.   ACTIVITY LIMITATIONS: carrying, lifting, bending, standing, squatting, stairs, reach over head, and hygiene/grooming  PARTICIPATION LIMITATIONS: cleaning, laundry, driving, shopping, community activity, and yard work  PERSONAL FACTORS: Age and 1-2 comorbidities: anxiety, HTN  are also affecting patient's functional outcome.   REHAB POTENTIAL: Good  CLINICAL DECISION MAKING: Evolving/moderate complexity  EVALUATION COMPLEXITY: Moderate   GOALS: Goals reviewed with patient? Yes  SHORT TERM GOALS: Target date: 09/05/2022  Pt will be independent with HEP in order to improve strength and decrease back pain in order to improve pain-free function at home and work.    Baseline: EVAL- Patient with no formal HEP in place Goal status: INITIAL    LONG TERM GOALS: Target date: 10/17/2022  Pt will decrease  worst neck pain as reported on NPRS by at least 2 points in order to demonstrate clinically significant reduction in back pai Baseline: EVAL = 4/10 Goal status: INITIAL  2.  Pt will demonstrate decrease in NDI by at least 19% in order to demonstrate clinically  significant reduction in disability related to neck injury/pain  Baseline: Eval= 24% Goal status: INITIAL  3.  Pt will improve FOTO to target score to display perceived improvements in ability to complete ADL's.  Baseline:  Goal status: INITIAL  4.  Pt will improve FGA by at least 4 points in order to demonstrate clinically significant improvement in balance.   Baseline: 15/30 Goal status: INITIAL  5.  Pt will decrease 5TSTS by at least 3 seconds in order to demonstrate clinically significant improvement in LE strength. Baseline: 19.48 sec  Goal status: INITIAL  6.  Pt will increase by at least 0.13 m/s in order to demonstrate clinically significant improvement in community ambulation.   Baseline: EVAL= 0.66 m/s Goal status: INITIAL  7.  Patient will present with > 5 deg improved cervical Rotation with active motion for improved ability turn his head while walking or driving.  Baseline: EVAL- Cervical Rotation L/R= 52/75 deg Goal status: INITIAL  PLAN:  PT FREQUENCY: 1-2x/week  PT DURATION: 12 weeks  PLANNED INTERVENTIONS: Therapeutic exercises, Therapeutic activity, Neuromuscular re-education, Balance training, Gait training, Patient/Family education, Self Care, Joint mobilization, Joint manipulation, Stair training, Vestibular training, Canalith repositioning, DME instructions, Dry Needling, Electrical stimulation, Spinal manipulation, Spinal mobilization, Cryotherapy, Moist heat, Manual therapy, and Re-evaluation  PLAN FOR NEXT SESSION: Continue with manual therapy for pain relief and ROM of c-spine and balance training for impaired balance.    Temple Pacini PT, DPT 08/24/2022, 11:51 AM

## 2022-08-27 ENCOUNTER — Ambulatory Visit: Payer: Medicare Other

## 2022-08-27 DIAGNOSIS — R2681 Unsteadiness on feet: Secondary | ICD-10-CM

## 2022-08-27 DIAGNOSIS — M542 Cervicalgia: Secondary | ICD-10-CM

## 2022-08-27 DIAGNOSIS — M5382 Other specified dorsopathies, cervical region: Secondary | ICD-10-CM

## 2022-08-27 NOTE — Therapy (Signed)
OUTPATIENT PHYSICAL THERAPY CERVICAL and BALANCE TREATMENT   Patient Name: Billy Berg MRN: [id number] DOB:05/31/1940, 82 y.o., male Today's Date: 08/27/2022  END OF SESSION:  PT End of Session - 08/27/22 1146     Visit Number 8    Number of Visits 24    Date for PT Re-Evaluation 10/17/22    Progress Note Due on Visit 10    PT Start Time 1148    PT Stop Time 1229    PT Time Calculation (min) 41 min    Equipment Utilized During Treatment Gait belt    Activity Tolerance Patient tolerated treatment well    Behavior During Therapy WFL for tasks assessed/performed                Past Medical History:  Diagnosis Date   Anxiety    Arthritis    GERD (gastroesophageal reflux disease)    History of kidney stones    Hyperlipidemia    Hypertension    Sleep apnea    Wears hearing aid in both ears    Past Surgical History:  Procedure Laterality Date   CATARACT EXTRACTION W/PHACO Right 07/18/2021   Procedure: CATARACT EXTRACTION PHACO AND INTRAOCULAR LENS PLACEMENT (IOC) RIGHT;  Surgeon: Galen Manila, MD;  Location: [facility];  Service: Ophthalmology;  Laterality: Right;  sleep apnea 8.51 00:48.6   CATARACT EXTRACTION W/PHACO Left 08/01/2021   Procedure: CATARACT EXTRACTION PHACO AND INTRAOCULAR LENS PLACEMENT (IOC) LEFT 8.06 01:02.5;  Surgeon: Galen Manila, MD;  Location: [facility];  Service: Ophthalmology;  Laterality: Left;  sleep apnea   COLONOSCOPY     COLONOSCOPY WITH PROPOFOL N/A 10/14/2017   Procedure: COLONOSCOPY WITH PROPOFOL;  Surgeon: Scot Jun, MD;  Location: [facility];  Service: Endoscopy;  Laterality: N/A;   JOINT REPLACEMENT Right 04/2007   KNEE   TONSILLECTOMY     There are no problems to display for this patient.   PCP: Dr Karthik Sites  REFERRING PROVIDER: Dr. Heywood Sites  REFERRING DIAG: Neck pain; Balance disorder  THERAPY DIAG:  Cervicalgia  Unsteadiness on feet  Limited active range of motion of  cervical spine on rotation to right  Rationale for Evaluation and Treatment: Rehabilitation  ONSET DATE: Around 6 months ago (02/2022)  SUBJECTIVE:                                                                                                                                                                                                         SUBJECTIVE STATEMENT: Pt presents with DTR to appt. He rates  his neck pain 1/0. He reports it doesn't hurt when still but does hurt when he moves. Pt reports no stumbles/falls. He had a good father's day.   Hand dominance: Right  PERTINENT HISTORY:   Patient presents with referral to outpatient PT following appt with Dr. Graciela Husbands for annual exam- including some neck and back pain and imbalance.   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  COLONOSCOPY 12/03/2013  Four 1-5 mm adenomas, sigmoid diverticulosis, internal hemorrhoids; repeat in 11/2016.  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 Yes: NPRS scale: 3-4/10 Pain location: Left upper trap region Pain description: ache Aggravating factors: lifting/driving, turning my head Relieving factors: rest, meds, occasional use of heat  PRECAUTIONS: Fall  WEIGHT BEARING RESTRICTIONS: No  FALLS:  Has patient fallen in last 6 months? Yes. Number of falls 2  LIVING ENVIRONMENT: Lives with: lives with their spouse Lives in: House/apartment Stairs: Yes: Internal: 12 steps; can reach both Has following equipment at home: None  OCCUPATION: Retired Software engineer  PLOF: Independent  PATIENT GOALS: To  have improved flexibility in my neck and improve my balance  NEXT MD VISIT: none  OBJECTIVE:   DIAGNOSTIC FINDINGS:  None recent  PATIENT SURVEYS:  NDI 24% indicating mild disability FOTO 58  COGNITION: Overall cognitive status: Within functional limits for tasks assessed- does revert to wife at times to answer questions  SENSATION: WFL with all 4 extremities  POSTURE: rounded shoulders and forward head  PALPATION: (+) tenderness along left Upper trap region with taught bands identified   CERVICAL ROM:   Active ROM A/PROM (deg) eval  Flexion 45  Extension 68  Right lateral flexion 18*  Left lateral flexion 26*  Right rotation 75*  Left rotation 52*   (Blank rows = not tested)    UPPER EXTREMITY MMT:  MMT Right eval Left eval  Shoulder flexion 5 4  Shoulder extension NT 4  Shoulder abduction 5 4  Shoulder adduction 5 4  Shoulder internal rotation 5 4  Shoulder external rotation 5 4  Elbow flexion 5 4+  Elbow extension 5 4+   (Blank rows = not tested)  Strength R/L 4/4 Hip flexion 4/4 Hip external rotation 4/4 Hip internal rotation 4/4 Hip extension  4/4 Hip abduction 5/5 Hip adduction 4/4 Knee extension 5/5 Knee flexion NT- Ankle Plantarflexion 5/5 Ankle Dorsiflexion     CERVICAL SPECIAL TESTS:  Spurling's test: Negative   Gait - Wide base of support- decreased step length- waddle gait pattern approx 120 feet total.   FUNCTIONAL TESTS:  5 times sit to stand: 19.48 10 meter walk test: 15.10sec or 0.66 m/s Functional gait assessment: 15/30  TODAY'S TREATMENT:  DATE: 08/27/22     NMR: Gait belt donned and CGA provided unless specified otherwise. Interventions completed at support surface On airex: Semi-tandem 2x30 sec each LE. Pt rates medium. Intermittent UE support  NBOS with vertical and horizontal head  turns 10x for each --pt then repeats on airex pad   Alt LE march 2x20. Cuing throughout for decreased speed. Pt continues to difficult  Tandem gait in // bars x multiple reps length of bars, some difficulty performing fully tandem, performs a few reps semi-tandem. Intermittent UE support throughout.  Cone taps SLB progression x multiple reps each LE. Challenging for pt  One foot on floor, one foot on 6" step 2x30 sec each LE --Pt then pt repeats position with horizontal and vertical head turns 10x for each in each LE position   TE: Seated cervical rotation stretch 60 sec each side Seated UT stretch 60 sec each side Rhomboids stretch 60 sec Cervical circles AROM 10x each way AROM cervical rotation 10x  AROM cervical flex/ext 10x   PATIENT EDUCATION:  Education details: Pt educated throughout session about proper posture and technique with exercises. Improved exercise technique, movement at target joints, use of target muscles after min to mod verbal, visual, tactile cues.  Person educated: Patient/wife Education method: Explanation, Demonstration, Tactile cues, and Verbal cues Education comprehension: verbalized understanding, returned demonstration, verbal cues required, tactile cues required, and needs further education  HOME EXERCISE PROGRAM: Access Code: [email or web address] URL: [email or web address] Date: 07/31/2022 Prepared by: Maureen Ralphs  Exercises - Seated Shoulder Rolls  - 1 x daily - 7 x weekly - 3 sets - 10 reps - Standing Shoulder Row with Anchored Resistance  - 3 x weekly - 3 sets - 10 reps - Supine Chin Tuck  - 1 x daily - 7 x weekly - 3 sets - 10 reps - Shoulder extension with resistance - Neutral  - 1 x daily - 3 x weekly - 3 sets - 10 reps     Access Code: [email or web address] URL: [email or web address] Date: 07/25/2022 Prepared by: Maureen Ralphs  Exercises - Seated Cervical Sidebending Stretch  - 2 x daily - 3 sets - 30 sec hold -  Seated Cervical Rotation AROM  - 2 x daily - 3 sets - 10 reps  ASSESSMENT:  CLINICAL IMPRESSION: Pt presents with decreased cervical pain level. Focus of session primarily on balance. The pt exhibits improved steadiness with head turns but still struggle with tandem gait. The pt will benefit from further skilled PT to improve strength, balance, pain and mobility.   OBJECTIVE IMPAIRMENTS: Abnormal gait, decreased activity tolerance, decreased balance, decreased coordination, decreased endurance, decreased mobility, difficulty walking, decreased ROM, decreased strength, hypomobility, and pain.   ACTIVITY LIMITATIONS: carrying, lifting, bending, standing, squatting, stairs, reach over head, and hygiene/grooming  PARTICIPATION LIMITATIONS: cleaning, laundry, driving, shopping, community activity, and yard work  PERSONAL FACTORS: Age and 1-2 comorbidities: anxiety, HTN  are also affecting patient's functional outcome.   REHAB POTENTIAL: Good  CLINICAL DECISION MAKING: Evolving/moderate complexity  EVALUATION COMPLEXITY: Moderate   GOALS: Goals reviewed with patient? Yes  SHORT TERM GOALS: Target date: 09/05/2022  Pt will be independent with HEP in order to improve strength and decrease back pain in order to improve pain-free function at home and work.    Baseline: EVAL- Patient with no formal HEP in place Goal status: INITIAL    LONG TERM GOALS: Target date: 10/17/2022  Pt will decrease worst neck pain as reported on NPRS by at least 2 points in  order to demonstrate clinically significant reduction in back pai Baseline: EVAL = 4/10 Goal status: INITIAL  2.  Pt will demonstrate decrease in NDI by at least 19% in order to demonstrate clinically significant reduction in disability related to neck injury/pain  Baseline: Eval= 24% Goal status: INITIAL  3.  Pt will improve FOTO to target score to display perceived improvements in ability to complete ADL's.  Baseline:  Goal status:  INITIAL  4.  Pt will improve FGA by at least 4 points in order to demonstrate clinically significant improvement in balance.   Baseline: 15/30 Goal status: INITIAL  5.  Pt will decrease 5TSTS by at least 3 seconds in order to demonstrate clinically significant improvement in LE strength. Baseline: 19.48 sec  Goal status: INITIAL  6.  Pt will increase by at least 0.13 m/s in order to demonstrate clinically significant improvement in community ambulation.   Baseline: EVAL= 0.66 m/s Goal status: INITIAL  7.  Patient will present with > 5 deg improved cervical Rotation with active motion for improved ability turn his head while walking or driving.  Baseline: EVAL- Cervical Rotation L/R= 52/75 deg Goal status: INITIAL  PLAN:  PT FREQUENCY: 1-2x/week  PT DURATION: 12 weeks  PLANNED INTERVENTIONS: Therapeutic exercises, Therapeutic activity, Neuromuscular re-education, Balance training, Gait training, Patient/Family education, Self Care, Joint mobilization, Joint manipulation, Stair training, Vestibular training, Canalith repositioning, DME instructions, Dry Needling, Electrical stimulation, Spinal manipulation, Spinal mobilization, Cryotherapy, Moist heat, Manual therapy, and Re-evaluation  PLAN FOR NEXT SESSION: Continue with manual therapy for pain relief and ROM of c-spine and balance training for impaired balance.    Temple Pacini PT, DPT 08/27/2022, 12:31 PM

## 2022-08-29 ENCOUNTER — Ambulatory Visit: Payer: Medicare Other

## 2022-08-29 DIAGNOSIS — R2681 Unsteadiness on feet: Secondary | ICD-10-CM

## 2022-08-29 DIAGNOSIS — M5382 Other specified dorsopathies, cervical region: Secondary | ICD-10-CM

## 2022-08-29 DIAGNOSIS — M542 Cervicalgia: Secondary | ICD-10-CM | POA: Diagnosis not present

## 2022-08-29 NOTE — Therapy (Signed)
OUTPATIENT PHYSICAL THERAPY CERVICAL and BALANCE TREATMENT   Patient Name: Billy Berg MRN: [id number] DOB:1941/01/12, 82 y.o., male Today's Date: 08/29/2022  END OF SESSION:  PT End of Session - 08/29/22 1150     Visit Number 9    Number of Visits 24    Date for PT Re-Evaluation 10/17/22    Progress Note Due on Visit 10    PT Start Time 1147    PT Stop Time 1227    PT Time Calculation (min) 40 min    Equipment Utilized During Treatment Gait belt    Activity Tolerance Patient tolerated treatment well    Behavior During Therapy WFL for tasks assessed/performed                Past Medical History:  Diagnosis Date   Anxiety    Arthritis    GERD (gastroesophageal reflux disease)    History of kidney stones    Hyperlipidemia    Hypertension    Sleep apnea    Wears hearing aid in both ears    Past Surgical History:  Procedure Laterality Date   CATARACT EXTRACTION W/PHACO Right 07/18/2021   Procedure: CATARACT EXTRACTION PHACO AND INTRAOCULAR LENS PLACEMENT (IOC) RIGHT;  Surgeon: Galen Manila, MD;  Location: [facility];  Service: Ophthalmology;  Laterality: Right;  sleep apnea 8.51 00:48.6   CATARACT EXTRACTION W/PHACO Left 08/01/2021   Procedure: CATARACT EXTRACTION PHACO AND INTRAOCULAR LENS PLACEMENT (IOC) LEFT 8.06 01:02.5;  Surgeon: Galen Manila, MD;  Location: [facility];  Service: Ophthalmology;  Laterality: Left;  sleep apnea   COLONOSCOPY     COLONOSCOPY WITH PROPOFOL N/A 10/14/2017   Procedure: COLONOSCOPY WITH PROPOFOL;  Surgeon: Scot Jun, MD;  Location: [facility];  Service: Endoscopy;  Laterality: N/A;   JOINT REPLACEMENT Right 04/2007   KNEE   TONSILLECTOMY     There are no problems to display for this patient.   PCP: Dr Raunak Sites  REFERRING PROVIDER: Dr. Antwoine Sites  REFERRING DIAG: Neck pain; Balance disorder  THERAPY DIAG:  Cervicalgia  Limited active range of motion (AROM) of cervical spine on  lateral flexion to left  Unsteadiness on feet  Rationale for Evaluation and Treatment: Rehabilitation  ONSET DATE: Around 6 months ago (02/2022)  SUBJECTIVE:                                                                                                                                                                                                         SUBJECTIVE STATEMENT: Pt reports continued neck pain. He  rates it about a 2/10. He is also having low back pain. Pt spouse reports no stumbles/falls.  Hand dominance: Right  PERTINENT HISTORY:   Patient presents with referral to outpatient PT following appt with Dr. Graciela Husbands for annual exam- including some neck and back pain and imbalance.   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  COLONOSCOPY 12/03/2013  Four 1-5 mm adenomas, sigmoid diverticulosis, internal hemorrhoids; repeat in 11/2016.  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 Yes: NPRS scale: 3-4/10 Pain location: Left upper trap region Pain description: ache Aggravating factors: lifting/driving, turning my head Relieving factors: rest, meds, occasional use of heat  PRECAUTIONS: Fall  WEIGHT BEARING RESTRICTIONS: No  FALLS:  Has patient fallen in last 6 months? Yes. Number of falls 2  LIVING ENVIRONMENT: Lives with: lives with their spouse Lives in: House/apartment Stairs: Yes: Internal: 12 steps; can reach both Has following equipment at home: None  OCCUPATION: Retired Software engineer  PLOF: Independent  PATIENT GOALS: To have improved flexibility in my neck and  improve my balance  NEXT MD VISIT: none  OBJECTIVE:   DIAGNOSTIC FINDINGS:  None recent  PATIENT SURVEYS:  NDI 24% indicating mild disability FOTO 20  COGNITION: Overall cognitive status: Within functional limits for tasks assessed- does revert to wife at times to answer questions  SENSATION: WFL with all 4 extremities  POSTURE: rounded shoulders and forward head  PALPATION: (+) tenderness along left Upper trap region with taught bands identified   CERVICAL ROM:   Active ROM A/PROM (deg) eval  Flexion 45  Extension 68  Right lateral flexion 18*  Left lateral flexion 26*  Right rotation 75*  Left rotation 52*   (Blank rows = not tested)    UPPER EXTREMITY MMT:  MMT Right eval Left eval  Shoulder flexion 5 4  Shoulder extension NT 4  Shoulder abduction 5 4  Shoulder adduction 5 4  Shoulder internal rotation 5 4  Shoulder external rotation 5 4  Elbow flexion 5 4+  Elbow extension 5 4+   (Blank rows = not tested)  Strength R/L 4/4 Hip flexion 4/4 Hip external rotation 4/4 Hip internal rotation 4/4 Hip extension  4/4 Hip abduction 5/5 Hip adduction 4/4 Knee extension 5/5 Knee flexion NT- Ankle Plantarflexion 5/5 Ankle Dorsiflexion     CERVICAL SPECIAL TESTS:  Spurling's test: Negative   Gait - Wide base of support- decreased step length- waddle gait pattern approx 120 feet total.   FUNCTIONAL TESTS:  5 times sit to stand: 19.48 10 meter walk test: 15.10sec or 0.66 m/s Functional gait assessment: 15/30  TODAY'S TREATMENT:  DATE: 08/29/22  NMR: Gait belt donned and CGA provided unless specified otherwise. Interventions completed at support surface  Tandem gait in // bars followed by retro-gait x multiple reps length of bars. Pt rates medium, intermittent UE support throughout  On airex: Semi-tandem 2x30 sec each  LE. Pt rates medium. Intermittent UE support throughout NBOS with EC 2x30 sec NBOS with EO vertical and horizontal head turns 10x for each Alt LE march 2x20. Cuing throughout for decreased speed. Difficult, pt has difficulty with eccentric control  Orange hurdle FWD/BCKWD and LTL step-overs x multiple for each. Very challenging for pt. Pt knocks over hurdle multiple times. Pt requires intermittent UE support.    Manual: Pt seated- PT provides STM and TrP release to bilateral UT and cervical paraspinals and mm of occipital triangle. PT additionally cues pt through long-duration cervical rotation and lateral flexion stretching.  TE: Cervical isometrics: lat flex 10x with 3 sec hold/rep completed each side, and cervical ext 10x with 3 sec hold/rep Seated thoracic ext over chair 10x  Levator stretch 30 sec each side   PATIENT EDUCATION:  Education details: Pt educated throughout session about proper posture and technique with exercises. Improved exercise technique, movement at target joints, use of target muscles after min to mod verbal, visual, tactile cues.  Person educated: Patient/wife Education method: Explanation, Demonstration, Tactile cues, and Verbal cues Education comprehension: verbalized understanding, returned demonstration, verbal cues required, tactile cues required, and needs further education  HOME EXERCISE PROGRAM: Access Code: [email or web address] URL: [email or web address] Date: 07/31/2022 Prepared by: Maureen Ralphs  Exercises - Seated Shoulder Rolls  - 1 x daily - 7 x weekly - 3 sets - 10 reps - Standing Shoulder Row with Anchored Resistance  - 3 x weekly - 3 sets - 10 reps - Supine Chin Tuck  - 1 x daily - 7 x weekly - 3 sets - 10 reps - Shoulder extension with resistance - Neutral  - 1 x daily - 3 x weekly - 3 sets - 10 reps     Access Code: [email or web address] URL: [email or web address] Date: 07/25/2022 Prepared by: Maureen Ralphs  Exercises - Seated Cervical Sidebending Stretch  - 2 x daily - 3 sets - 30 sec hold - Seated Cervical Rotation AROM  - 2 x daily - 3 sets - 10 reps  ASSESSMENT:  CLINICAL IMPRESSION: Instructed pt in cervical isometric strengthening following manual today. Pt with minimal relief in session, although he has been reporting lower pain levels upon arrival to PT.  Other part of session focused on improving balance, where pt had difficulty with obstacle clearance.  The pt will benefit from further skilled PT to improve strength, balance, pain and mobility.   OBJECTIVE IMPAIRMENTS: Abnormal gait, decreased activity tolerance, decreased balance, decreased coordination, decreased endurance, decreased mobility, difficulty walking, decreased ROM, decreased strength, hypomobility, and pain.   ACTIVITY LIMITATIONS: carrying, lifting, bending, standing, squatting, stairs, reach over head, and hygiene/grooming  PARTICIPATION LIMITATIONS: cleaning, laundry, driving, shopping, community activity, and yard work  PERSONAL FACTORS: Age and 1-2 comorbidities: anxiety, HTN  are also affecting patient's functional outcome.   REHAB POTENTIAL: Good  CLINICAL DECISION MAKING: Evolving/moderate complexity  EVALUATION COMPLEXITY: Moderate   GOALS: Goals reviewed with patient? Yes  SHORT TERM GOALS: Target date: 09/05/2022  Pt will be independent with HEP in order to improve strength and decrease back pain in order to improve pain-free function at home and work.    Baseline: EVAL- Patient with no formal HEP in place Goal status: INITIAL  LONG TERM GOALS: Target date: 10/17/2022  Pt will decrease worst neck pain as reported on NPRS by at least 2 points in order to demonstrate clinically significant reduction in back pai Baseline: EVAL = 4/10 Goal status: INITIAL  2.  Pt will demonstrate decrease in NDI by at least 19% in order to demonstrate clinically significant reduction in disability  related to neck injury/pain  Baseline: Eval= 24% Goal status: INITIAL  3.  Pt will improve FOTO to target score to display perceived improvements in ability to complete ADL's.  Baseline:  Goal status: INITIAL  4.  Pt will improve FGA by at least 4 points in order to demonstrate clinically significant improvement in balance.   Baseline: 15/30 Goal status: INITIAL  5.  Pt will decrease 5TSTS by at least 3 seconds in order to demonstrate clinically significant improvement in LE strength. Baseline: 19.48 sec  Goal status: INITIAL  6.  Pt will increase by at least 0.13 m/s in order to demonstrate clinically significant improvement in community ambulation.   Baseline: EVAL= 0.66 m/s Goal status: INITIAL  7.  Patient will present with > 5 deg improved cervical Rotation with active motion for improved ability turn his head while walking or driving.  Baseline: EVAL- Cervical Rotation L/R= 52/75 deg Goal status: INITIAL  PLAN:  PT FREQUENCY: 1-2x/week  PT DURATION: 12 weeks  PLANNED INTERVENTIONS: Therapeutic exercises, Therapeutic activity, Neuromuscular re-education, Balance training, Gait training, Patient/Family education, Self Care, Joint mobilization, Joint manipulation, Stair training, Vestibular training, Canalith repositioning, DME instructions, Dry Needling, Electrical stimulation, Spinal manipulation, Spinal mobilization, Cryotherapy, Moist heat, Manual therapy, and Re-evaluation  PLAN FOR NEXT SESSION: Continue with manual therapy for pain relief and ROM of c-spine and balance training for impaired balance.    Temple Pacini PT, DPT 08/29/2022, 5:43 PM

## 2022-09-03 ENCOUNTER — Ambulatory Visit: Payer: Medicare Other

## 2022-09-03 DIAGNOSIS — M542 Cervicalgia: Secondary | ICD-10-CM | POA: Diagnosis not present

## 2022-09-03 DIAGNOSIS — M6281 Muscle weakness (generalized): Secondary | ICD-10-CM

## 2022-09-03 DIAGNOSIS — R2681 Unsteadiness on feet: Secondary | ICD-10-CM

## 2022-09-03 DIAGNOSIS — R262 Difficulty in walking, not elsewhere classified: Secondary | ICD-10-CM

## 2022-09-03 NOTE — Therapy (Signed)
OUTPATIENT PHYSICAL THERAPY CERVICAL and BALANCE TREATMENT/Physical Therapy Progress Note   Dates of reporting period  07/25/2022   to   09/03/2022    Patient Name: Billy Berg MRN: [id number] DOB:November 05, 1940, 82 y.o., male Today's Date: 09/03/2022  END OF SESSION:  PT End of Session - 09/03/22 1656     Visit Number 10    Number of Visits 24    Date for PT Re-Evaluation 10/17/22    Progress Note Due on Visit 10    PT Start Time 1656    PT Stop Time 1730    PT Time Calculation (min) 34 min    Equipment Utilized During Treatment Gait belt    Activity Tolerance Patient tolerated treatment well;No increased pain    Behavior During Therapy WFL for tasks assessed/performed                 Past Medical History:  Diagnosis Date   Anxiety    Arthritis    GERD (gastroesophageal reflux disease)    History of kidney stones    Hyperlipidemia    Hypertension    Sleep apnea    Wears hearing aid in both ears    Past Surgical History:  Procedure Laterality Date   CATARACT EXTRACTION W/PHACO Right 07/18/2021   Procedure: CATARACT EXTRACTION PHACO AND INTRAOCULAR LENS PLACEMENT (IOC) RIGHT;  Surgeon: Galen Manila, MD;  Location: [facility];  Service: Ophthalmology;  Laterality: Right;  sleep apnea 8.51 00:48.6   CATARACT EXTRACTION W/PHACO Left 08/01/2021   Procedure: CATARACT EXTRACTION PHACO AND INTRAOCULAR LENS PLACEMENT (IOC) LEFT 8.06 01:02.5;  Surgeon: Galen Manila, MD;  Location: [facility];  Service: Ophthalmology;  Laterality: Left;  sleep apnea   COLONOSCOPY     COLONOSCOPY WITH PROPOFOL N/A 10/14/2017   Procedure: COLONOSCOPY WITH PROPOFOL;  Surgeon: Scot Jun, MD;  Location: [facility];  Service: Endoscopy;  Laterality: N/A;   JOINT REPLACEMENT Right 04/2007   KNEE   TONSILLECTOMY     There are no problems to display for this patient.   PCP: Dr Zacharias Sites  REFERRING PROVIDER: Dr. Xzavien Sites  REFERRING DIAG: Neck pain;  Balance disorder  THERAPY DIAG:  Cervicalgia  Unsteadiness on feet  Difficulty in walking, not elsewhere classified  Muscle weakness (generalized)  Rationale for Evaluation and Treatment: Rehabilitation  ONSET DATE: Around 6 months ago (02/2022)  SUBJECTIVE:  SUBJECTIVE STATEMENT: Pt presents late to appointment, reports he forgot about today's appointment and realized he had PT last minute. Pt reports continued R side neck pain, and reports no stumbles/falls.  Hand dominance: Right  PERTINENT HISTORY:   Patient presents with referral to outpatient PT following appt with Dr. Graciela Husbands for annual exam- including some neck and back pain and imbalance.   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  COLONOSCOPY 12/03/2013  Four 1-5 mm adenomas, sigmoid diverticulosis, internal hemorrhoids; repeat in 11/2016.  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 Yes: NPRS scale: 3-4/10 Pain location: Left upper trap region Pain description: ache Aggravating factors: lifting/driving, turning my head Relieving factors: rest, meds, occasional use of heat  PRECAUTIONS: Fall  WEIGHT BEARING RESTRICTIONS: No  FALLS:  Has patient fallen in last 6 months? Yes. Number of falls 2  LIVING ENVIRONMENT: Lives with: lives with their spouse Lives in: House/apartment Stairs: Yes: Internal: 12 steps; can reach both Has following  equipment at home: None  OCCUPATION: Retired Software engineer  PLOF: Independent  PATIENT GOALS: To have improved flexibility in my neck and improve my balance  NEXT MD VISIT: none  OBJECTIVE:   DIAGNOSTIC FINDINGS:  None recent  PATIENT SURVEYS:  NDI 24% indicating mild disability FOTO 71  COGNITION: Overall cognitive status: Within functional limits for tasks assessed- does revert to wife at times to answer questions  SENSATION: WFL with all 4 extremities  POSTURE: rounded shoulders and forward head  PALPATION: (+) tenderness along left Upper trap region with taught bands identified   CERVICAL ROM:   Active ROM A/PROM (deg) eval  Flexion 45  Extension 68  Right lateral flexion 18*  Left lateral flexion 26*  Right rotation 75*  Left rotation 52*   (Blank rows = not tested)    UPPER EXTREMITY MMT:  MMT Right eval Left eval  Shoulder flexion 5 4  Shoulder extension NT 4  Shoulder abduction 5 4  Shoulder adduction 5 4  Shoulder internal rotation 5 4  Shoulder external rotation 5 4  Elbow flexion 5 4+  Elbow extension 5 4+   (Blank rows = not tested)  Strength R/L 4/4 Hip flexion 4/4 Hip external rotation 4/4 Hip internal rotation 4/4 Hip extension  4/4 Hip abduction 5/5 Hip adduction 4/4 Knee extension 5/5 Knee flexion NT- Ankle Plantarflexion 5/5 Ankle Dorsiflexion     CERVICAL SPECIAL TESTS:  Spurling's test: Negative   Gait - Wide base of support- decreased step length- waddle gait pattern approx 120 feet total.   FUNCTIONAL TESTS:  5 times sit to stand: 19.48 10 meter walk test: 15.10sec or 0.66 m/s Functional gait assessment: 15/30  TODAY'S TREATMENT:  DATE: 09/03/22  TE: Cervical rotation stretch 30 sec each side, 60 sec each side Cervical rotation AROM R and L 10x  TA: Goal retesting  completed on this date. Please refer to goal section below for details. PT instructed pt throughout in goals and goal performance, progress, plan.   PATIENT EDUCATION:  Education details: Pt educated throughout session about proper posture and technique with exercises. Improved exercise technique, movement at target joints, use of target muscles after min to mod verbal, visual, tactile cues.  Person educated: Patient/wife Education method: Explanation, Demonstration, Tactile cues, and Verbal cues Education comprehension: verbalized understanding, returned demonstration, verbal cues required, tactile cues required, and needs further education  HOME EXERCISE PROGRAM: Access Code: [email or web address] URL: [email or web address] Date: 07/31/2022 Prepared by: Maureen Ralphs  Exercises - Seated Shoulder Rolls  - 1 x daily - 7 x weekly - 3 sets - 10 reps - Standing Shoulder Row with Anchored Resistance  - 3 x weekly - 3 sets - 10 reps - Supine Chin Tuck  - 1 x daily - 7 x weekly - 3 sets - 10 reps - Shoulder extension with resistance - Neutral  - 1 x daily - 3 x weekly - 3 sets - 10 reps     Access Code: [email or web address] URL: [email or web address] Date: 07/25/2022 Prepared by: Maureen Ralphs  Exercises - Seated Cervical Sidebending Stretch  - 2 x daily - 3 sets - 30 sec hold - Seated Cervical Rotation AROM  - 2 x daily - 3 sets - 10 reps  ASSESSMENT:  CLINICAL IMPRESSION: Goal testing completed for progress note. Pt making gains AEB achieving 3 goals and partially meeting 2 other goals, with improvement in balance, neck pain, LE strength/power, and a decrease in fall risk, as well as improved perception in functional mobility and QOL. Pt only with decrease in cervical ROM testing on this date, plan to further address this deficit.  NDI deferred due to time limitations. The pt will benefit from further skilled PT to improve strength, balance, pain and  mobility.   OBJECTIVE IMPAIRMENTS: Abnormal gait, decreased activity tolerance, decreased balance, decreased coordination, decreased endurance, decreased mobility, difficulty walking, decreased ROM, decreased strength, hypomobility, and pain.   ACTIVITY LIMITATIONS: carrying, lifting, bending, standing, squatting, stairs, reach over head, and hygiene/grooming  PARTICIPATION LIMITATIONS: cleaning, laundry, driving, shopping, community activity, and yard work  PERSONAL FACTORS: Age and 1-2 comorbidities: anxiety, HTN  are also affecting patient's functional outcome.   REHAB POTENTIAL: Good  CLINICAL DECISION MAKING: Evolving/moderate complexity  EVALUATION COMPLEXITY: Moderate   GOALS: Goals reviewed with patient? Yes  SHORT TERM GOALS: Target date: 09/05/2022  Pt will be independent with HEP in order to improve strength and decrease back pain in order to improve pain-free function at home and work.    Baseline: EVAL- Patient with no formal HEP in place; 6/24: Pt reports he does his exercises maybe every other day, he is not entirely sure Goal status: IN PROGRESS    LONG TERM GOALS: Target date: 10/17/2022  Pt will decrease worst neck pain as reported on NPRS by at least 2 points in order to demonstrate clinically significant reduction in back pai Baseline: EVAL = 4/10; 6/24: 2-3/10 Goal status: PARTIALLY MET  2.  Pt will demonstrate decrease in NDI by at least 19% in order to demonstrate clinically significant reduction in disability related to neck injury/pain  Baseline: Eval= 24% Goal status: INITIAL  3.  Pt will improve FOTO to target score to display perceived improvements in ability  to complete ADL's.  Baseline:  6/24: 58 (previous score 57) Goal status: MET  4.  Pt will improve FGA by at least 4 points in order to demonstrate clinically significant improvement in balance.   Baseline: 15/30; 6/24: 25/30  Goal status: MET  5.  Pt will decrease 5TSTS by at least 3  seconds in order to demonstrate clinically significant improvement in LE strength. Baseline: 19.48 sec;  6/24: 17.5 sec Goal status: PARTIALLY MET  6.  Pt will increase by at least 0.13 m/s in order to demonstrate clinically significant improvement in community ambulation.   Baseline: EVAL= 0.66 m/s; 6/24: 1.0 m/s  Goal status: MET  7.  Patient will present with > 5 deg improved cervical Rotation with active motion for improved ability turn his head while walking or driving.  Baseline: EVAL- Cervical Rotation L/R= 52/75 deg; 09/03/22: Cervical Rotation L/R = 45/72  Goal status: INITIAL  PLAN:  PT FREQUENCY: 1-2x/week  PT DURATION: 12 weeks  PLANNED INTERVENTIONS: Therapeutic exercises, Therapeutic activity, Neuromuscular re-education, Balance training, Gait training, Patient/Family education, Self Care, Joint mobilization, Joint manipulation, Stair training, Vestibular training, Canalith repositioning, DME instructions, Dry Needling, Electrical stimulation, Spinal manipulation, Spinal mobilization, Cryotherapy, Moist heat, Manual therapy, and Re-evaluation  PLAN FOR NEXT SESSION: Continue with manual therapy for pain relief and ROM of c-spine and balance training for impaired balance.    Temple Pacini PT, DPT 09/03/2022, 5:33 PM

## 2022-09-05 ENCOUNTER — Ambulatory Visit: Payer: Medicare Other

## 2022-09-05 DIAGNOSIS — M5382 Other specified dorsopathies, cervical region: Secondary | ICD-10-CM

## 2022-09-05 DIAGNOSIS — M542 Cervicalgia: Secondary | ICD-10-CM | POA: Diagnosis not present

## 2022-09-05 DIAGNOSIS — M6281 Muscle weakness (generalized): Secondary | ICD-10-CM

## 2022-09-05 DIAGNOSIS — M25612 Stiffness of left shoulder, not elsewhere classified: Secondary | ICD-10-CM

## 2022-09-05 NOTE — Therapy (Signed)
OUTPATIENT PHYSICAL THERAPY CERVICAL and BALANCE TREATMENT   Patient Name: Billy Berg MRN: [id number] DOB:08/29/1940, 82 y.o., male Today's Date: 09/05/2022  END OF SESSION:  PT End of Session - 09/05/22 1613     Visit Number 11    Number of Visits 24    Date for PT Re-Evaluation 10/17/22    Progress Note Due on Visit 10    PT Start Time 1518    PT Stop Time 1559    PT Time Calculation (min) 41 min    Equipment Utilized During Treatment Gait belt    Activity Tolerance Patient tolerated treatment well;Patient limited by pain    Behavior During Therapy WFL for tasks assessed/performed                  Past Medical History:  Diagnosis Date   Anxiety    Arthritis    GERD (gastroesophageal reflux disease)    History of kidney stones    Hyperlipidemia    Hypertension    Sleep apnea    Wears hearing aid in both ears    Past Surgical History:  Procedure Laterality Date   CATARACT EXTRACTION W/PHACO Right 07/18/2021   Procedure: CATARACT EXTRACTION PHACO AND INTRAOCULAR LENS PLACEMENT (IOC) RIGHT;  Surgeon: Galen Manila, MD;  Location: [facility];  Service: Ophthalmology;  Laterality: Right;  sleep apnea 8.51 00:48.6   CATARACT EXTRACTION W/PHACO Left 08/01/2021   Procedure: CATARACT EXTRACTION PHACO AND INTRAOCULAR LENS PLACEMENT (IOC) LEFT 8.06 01:02.5;  Surgeon: Galen Manila, MD;  Location: [facility];  Service: Ophthalmology;  Laterality: Left;  sleep apnea   COLONOSCOPY     COLONOSCOPY WITH PROPOFOL N/A 10/14/2017   Procedure: COLONOSCOPY WITH PROPOFOL;  Surgeon: Scot Jun, MD;  Location: [facility];  Service: Endoscopy;  Laterality: N/A;   JOINT REPLACEMENT Right 04/2007   KNEE   TONSILLECTOMY     There are no problems to display for this patient.   PCP: Dr Viaan Sites  REFERRING PROVIDER: Dr. Stonewall Sites  REFERRING DIAG: Neck pain; Balance disorder  THERAPY DIAG:  Cervicalgia  Muscle weakness  (generalized)  Limited active range of motion (AROM) of cervical spine on lateral flexion to left  Limited active range of motion of cervical spine on rotation to right  Stiffness of left shoulder, not elsewhere classified  Rationale for Evaluation and Treatment: Rehabilitation  ONSET DATE: Around 6 months ago (02/2022)  SUBJECTIVE:  SUBJECTIVE STATEMENT: Pt reports his neck is feeling better today. Pt rates neck pain as 1-1.5/10.  Pt thinks his balance is doing better.   Hand dominance: Right  PERTINENT HISTORY:   Patient presents with referral to outpatient PT following appt with Dr. Graciela Husbands for annual exam- including some neck and back pain and imbalance.   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  COLONOSCOPY 12/03/2013  Four 1-5 mm adenomas, sigmoid diverticulosis, internal hemorrhoids; repeat in 11/2016.  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 Yes: NPRS scale: 3-4/10 Pain location: Left upper trap region Pain description: ache Aggravating factors: lifting/driving, turning my head Relieving factors: rest, meds, occasional use of heat  PRECAUTIONS: Fall  WEIGHT BEARING RESTRICTIONS: No  FALLS:  Has patient fallen in last 6 months? Yes. Number of falls 2  LIVING ENVIRONMENT: Lives with: lives with their spouse Lives in: House/apartment Stairs: Yes: Internal: 12 steps; can reach both Has  following equipment at home: None  OCCUPATION: Retired Software engineer  PLOF: Independent  PATIENT GOALS: To have improved flexibility in my neck and improve my balance  NEXT MD VISIT: none  OBJECTIVE:   DIAGNOSTIC FINDINGS:  None recent  PATIENT SURVEYS:  NDI 24% indicating mild disability FOTO 55  COGNITION: Overall cognitive status: Within functional limits for tasks assessed- does revert to wife at times to answer questions  SENSATION: WFL with all 4 extremities  POSTURE: rounded shoulders and forward head  PALPATION: (+) tenderness along left Upper trap region with taught bands identified   CERVICAL ROM:   Active ROM A/PROM (deg) eval  Flexion 45  Extension 68  Right lateral flexion 18*  Left lateral flexion 26*  Right rotation 75*  Left rotation 52*   (Blank rows = not tested)    UPPER EXTREMITY MMT:  MMT Right eval Left eval  Shoulder flexion 5 4  Shoulder extension NT 4  Shoulder abduction 5 4  Shoulder adduction 5 4  Shoulder internal rotation 5 4  Shoulder external rotation 5 4  Elbow flexion 5 4+  Elbow extension 5 4+   (Blank rows = not tested)  Strength R/L 4/4 Hip flexion 4/4 Hip external rotation 4/4 Hip internal rotation 4/4 Hip extension  4/4 Hip abduction 5/5 Hip adduction 4/4 Knee extension 5/5 Knee flexion NT- Ankle Plantarflexion 5/5 Ankle Dorsiflexion     CERVICAL SPECIAL TESTS:  Spurling's test: Negative   Gait - Wide base of support- decreased step length- waddle gait pattern approx 120 feet total.   FUNCTIONAL TESTS:  5 times sit to stand: 19.48 10 meter walk test: 15.10sec or 0.66 m/s Functional gait assessment: 15/30  TODAY'S TREATMENT:  DATE: 09/05/22  TE: Cervical rotation stretch 2x30 sec each side  UT stretch 2x30 sec each side  Rhomboids stretch 2x30 sec    AROM assessment: R flexion 150 deg L flexion 130 and painful R abduction 140 deg L abduction 125 deg - feels a stretch and has had pain   MMT Shoulder ER/IR both 4/5 bilat, only ER on L side is painful  Rows at cable machine: bilateral 2.5# 10x 4.5# 10x 7.5# 10x   Standing chin tucks 10x against towel roll - some difficulty with technique   Sci fit level 4 x 4 min FWD only. PT provides instruction in technique and monitors pt throughout for response to intervention  Shoulder shrug with 7# weights BUE with lower into UT stretch alternating sides 8x  TA:   NDI: 22%  PATIENT EDUCATION:  Education details: Pt educated throughout session about proper posture and technique with exercises. Improved exercise technique, movement at target joints, use of target muscles after min to mod verbal, visual, tactile cues. Further goal assessment  Person educated: Patient/wife Education method: Explanation, Demonstration, Tactile cues, and Verbal cues Education comprehension: verbalized understanding, returned demonstration, verbal cues required, tactile cues required, and needs further education  HOME EXERCISE PROGRAM: Access Code: [email or web address] URL: [email or web address] Date: 07/31/2022 Prepared by: Maureen Ralphs  Exercises - Seated Shoulder Rolls  - 1 x daily - 7 x weekly - 3 sets - 10 reps - Standing Shoulder Row with Anchored Resistance  - 3 x weekly - 3 sets - 10 reps - Supine Chin Tuck  - 1 x daily - 7 x weekly - 3 sets - 10 reps - Shoulder extension with resistance - Neutral  - 1 x daily - 3 x weekly - 3 sets - 10 reps     Access Code: [email or web address] URL: [email or web address] Date: 07/25/2022 Prepared by: Maureen Ralphs  Exercises - Seated Cervical Sidebending Stretch  - 2 x daily - 3 sets - 30 sec hold - Seated Cervical Rotation AROM  - 2 x daily - 3 sets - 10 reps  ASSESSMENT:  CLINICAL IMPRESSION: NDI completed today and pt exhibits  modest improvement in score (24% to 22%), indicating slight decrease in disability due to neck pain. PT observed pt having difficulty using LUE to assist with cervical stretches (mix of ER/flex/abduction), reporting pain-limitation. AROM assessment initiated and pt found to have impaired LUE AROM compared to RUE. He did report many years ago it was thought he "frayed" his L RTC, but is unsure if he had interventions for this at the time. Due to these findings, session focused on cervical stretch, RTC and parascapular muscle strengthening within pain-free ranges. Pt tolerated these interventions well without pain increase from baseline. The pt will benefit from further skilled PT to improve strength, balance, pain and mobility.   OBJECTIVE IMPAIRMENTS: Abnormal gait, decreased activity tolerance, decreased balance, decreased coordination, decreased endurance, decreased mobility, difficulty walking, decreased ROM, decreased strength, hypomobility, and pain.   ACTIVITY LIMITATIONS: carrying, lifting, bending, standing, squatting, stairs, reach over head, and hygiene/grooming  PARTICIPATION LIMITATIONS: cleaning, laundry, driving, shopping, community activity, and yard work  PERSONAL FACTORS: Age and 1-2 comorbidities: anxiety, HTN  are also affecting patient's functional outcome.   REHAB POTENTIAL: Good  CLINICAL DECISION MAKING: Evolving/moderate complexity  EVALUATION COMPLEXITY: Moderate   GOALS: Goals reviewed with patient? Yes  SHORT TERM GOALS: Target date: 09/05/2022  Pt will be independent with HEP in order to improve strength and decrease back pain in order to improve pain-free function  at home and work.    Baseline: EVAL- Patient with no formal HEP in place; 6/24: Pt reports he does his exercises maybe every other day, he is not entirely sure Goal status: IN PROGRESS    LONG TERM GOALS: Target date: 10/17/2022  Pt will decrease worst neck pain as reported on NPRS by at least 2  points in order to demonstrate clinically significant reduction in back pai Baseline: EVAL = 4/10; 6/24: 2-3/10 Goal status: PARTIALLY MET  2.  Pt will demonstrate decrease in NDI by at least 19% in order to demonstrate clinically significant reduction in disability related to neck injury/pain  Baseline: Eval= 24%; 09/05/22: 22% Goal status: IN PROGRESS  3.  Pt will improve FOTO to target score to display perceived improvements in ability to complete ADL's.  Baseline:  6/24: 58 (previous score 57) Goal status: MET  4.  Pt will improve FGA by at least 4 points in order to demonstrate clinically significant improvement in balance.   Baseline: 15/30; 6/24: 25/30  Goal status: MET  5.  Pt will decrease 5TSTS by at least 3 seconds in order to demonstrate clinically significant improvement in LE strength. Baseline: 19.48 sec;  6/24: 17.5 sec Goal status: PARTIALLY MET  6.  Pt will increase by at least 0.13 m/s in order to demonstrate clinically significant improvement in community ambulation.   Baseline: EVAL= 0.66 m/s; 6/24: 1.0 m/s  Goal status: MET  7.  Patient will present with > 5 deg improved cervical Rotation with active motion for improved ability turn his head while walking or driving.  Baseline: EVAL- Cervical Rotation L/R= 52/75 deg; 09/03/22: Cervical Rotation L/R = 45/72  Goal status: INITIAL  PLAN:  PT FREQUENCY: 1-2x/week  PT DURATION: 12 weeks  PLANNED INTERVENTIONS: Therapeutic exercises, Therapeutic activity, Neuromuscular re-education, Balance training, Gait training, Patient/Family education, Self Care, Joint mobilization, Joint manipulation, Stair training, Vestibular training, Canalith repositioning, DME instructions, Dry Needling, Electrical stimulation, Spinal manipulation, Spinal mobilization, Cryotherapy, Moist heat, Manual therapy, and Re-evaluation  PLAN FOR NEXT SESSION: Continue with manual therapy for pain relief and ROM of c-spine and balance  training for impaired balance, UE strengthening within pain-free ranges   Temple Pacini PT, DPT 09/05/2022, 4:16 PM

## 2022-09-10 ENCOUNTER — Ambulatory Visit: Payer: Medicare Other | Attending: Internal Medicine

## 2022-09-10 DIAGNOSIS — M25512 Pain in left shoulder: Secondary | ICD-10-CM | POA: Insufficient documentation

## 2022-09-10 DIAGNOSIS — R262 Difficulty in walking, not elsewhere classified: Secondary | ICD-10-CM | POA: Diagnosis present

## 2022-09-10 DIAGNOSIS — M25612 Stiffness of left shoulder, not elsewhere classified: Secondary | ICD-10-CM | POA: Insufficient documentation

## 2022-09-10 DIAGNOSIS — G8929 Other chronic pain: Secondary | ICD-10-CM | POA: Insufficient documentation

## 2022-09-10 DIAGNOSIS — M6281 Muscle weakness (generalized): Secondary | ICD-10-CM | POA: Diagnosis present

## 2022-09-10 DIAGNOSIS — M542 Cervicalgia: Secondary | ICD-10-CM | POA: Insufficient documentation

## 2022-09-10 DIAGNOSIS — R2681 Unsteadiness on feet: Secondary | ICD-10-CM | POA: Diagnosis present

## 2022-09-10 DIAGNOSIS — M5382 Other specified dorsopathies, cervical region: Secondary | ICD-10-CM | POA: Diagnosis present

## 2022-09-10 NOTE — Therapy (Signed)
OUTPATIENT PHYSICAL THERAPY CERVICAL and BALANCE TREATMENT   Patient Name: Billy Berg MRN: [id number] DOB:1940/12/31, 82 y.o., male Today's Date: 09/10/2022  END OF SESSION:  PT End of Session - 09/10/22 1731     Visit Number 12    Number of Visits 24    Date for PT Re-Evaluation 10/17/22    Progress Note Due on Visit 10    PT Start Time 1651    PT Stop Time 1730    PT Time Calculation (min) 39 min    Equipment Utilized During Treatment Gait belt    Activity Tolerance Patient tolerated treatment well    Behavior During Therapy WFL for tasks assessed/performed                  Past Medical History:  Diagnosis Date   Anxiety    Arthritis    GERD (gastroesophageal reflux disease)    History of kidney stones    Hyperlipidemia    Hypertension    Sleep apnea    Wears hearing aid in both ears    Past Surgical History:  Procedure Laterality Date   CATARACT EXTRACTION W/PHACO Right 07/18/2021   Procedure: CATARACT EXTRACTION PHACO AND INTRAOCULAR LENS PLACEMENT (IOC) RIGHT;  Surgeon: Galen Manila, MD;  Location: [facility];  Service: Ophthalmology;  Laterality: Right;  sleep apnea 8.51 00:48.6   CATARACT EXTRACTION W/PHACO Left 08/01/2021   Procedure: CATARACT EXTRACTION PHACO AND INTRAOCULAR LENS PLACEMENT (IOC) LEFT 8.06 01:02.5;  Surgeon: Galen Manila, MD;  Location: [facility];  Service: Ophthalmology;  Laterality: Left;  sleep apnea   COLONOSCOPY     COLONOSCOPY WITH PROPOFOL N/A 10/14/2017   Procedure: COLONOSCOPY WITH PROPOFOL;  Surgeon: Scot Jun, MD;  Location: [facility];  Service: Endoscopy;  Laterality: N/A;   JOINT REPLACEMENT Right 04/2007   KNEE   TONSILLECTOMY     There are no problems to display for this patient.   PCP: Dr Justun Sites  REFERRING PROVIDER: Dr. Erven Sites  REFERRING DIAG: Neck pain; Balance disorder  THERAPY DIAG:  Cervicalgia  Muscle weakness (generalized)  Unsteadiness on  feet  Rationale for Evaluation and Treatment: Rehabilitation  ONSET DATE: Around 6 months ago (02/2022)  SUBJECTIVE:                                                                                                                                                                                                         SUBJECTIVE STATEMENT: Pt continues to rate neck pain 1-1.15/10. Pt says balance has been "reasonable," and no  stumbles/falls.   Hand dominance: Right  PERTINENT HISTORY:   Patient presents with referral to outpatient PT following appt with Dr. Graciela Husbands for annual exam- including some neck and back pain and imbalance.   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  COLONOSCOPY 12/03/2013  Four 1-5 mm adenomas, sigmoid diverticulosis, internal hemorrhoids; repeat in 11/2016.  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 Yes: NPRS scale: 3-4/10 Pain location: Left upper trap region Pain description: ache Aggravating factors: lifting/driving, turning my head Relieving factors: rest, meds, occasional use of heat  PRECAUTIONS: Fall  WEIGHT BEARING RESTRICTIONS: No  FALLS:  Has patient fallen in last 6 months? Yes. Number of falls 2  LIVING ENVIRONMENT: Lives with: lives with their spouse Lives in: House/apartment Stairs: Yes: Internal: 12 steps; can reach both Has following equipment at home: None  OCCUPATION: Retired Software engineer  PLOF: Independent  PATIENT GOALS: To have improved flexibility in my neck and improve my balance  NEXT MD VISIT: none  OBJECTIVE:   DIAGNOSTIC  FINDINGS:  None recent  PATIENT SURVEYS:  NDI 24% indicating mild disability FOTO 4  COGNITION: Overall cognitive status: Within functional limits for tasks assessed- does revert to wife at times to answer questions  SENSATION: WFL with all 4 extremities  POSTURE: rounded shoulders and forward head  PALPATION: (+) tenderness along left Upper trap region with taught bands identified   CERVICAL ROM:   Active ROM A/PROM (deg) eval  Flexion 45  Extension 68  Right lateral flexion 18*  Left lateral flexion 26*  Right rotation 75*  Left rotation 52*   (Blank rows = not tested)    UPPER EXTREMITY MMT:  MMT Right eval Left eval  Shoulder flexion 5 4  Shoulder extension NT 4  Shoulder abduction 5 4  Shoulder adduction 5 4  Shoulder internal rotation 5 4  Shoulder external rotation 5 4  Elbow flexion 5 4+  Elbow extension 5 4+   (Blank rows = not tested)  Strength R/L 4/4 Hip flexion 4/4 Hip external rotation 4/4 Hip internal rotation 4/4 Hip extension  4/4 Hip abduction 5/5 Hip adduction 4/4 Knee extension 5/5 Knee flexion NT- Ankle Plantarflexion 5/5 Ankle Dorsiflexion     CERVICAL SPECIAL TESTS:  Spurling's test: Negative   Gait - Wide base of support- decreased step length- waddle gait pattern approx 120 feet total.   FUNCTIONAL TESTS:  5 times sit to stand: 19.48 10 meter walk test: 15.10sec or 0.66 m/s Functional gait assessment: 15/30  TODAY'S TREATMENT:  DATE: 09/10/22  TE: Cervical rotation stretch 2x30 sec each side  UT stretch 2x30 sec each side  Rhomboids stretch x60 sec   Rows at cable machine: bilateral 7.5# 3x10. Pt rates medium  Sci fit level 4 x 4 min FWD/BCKWD (2 min each). PT provides instruction in technique and monitors pt throughout for response to intervention   NMR: In // bars- Tandem  stance 2x30 sec each LE Cone taps x multiple reps each LE  Obstacle clearance: orange hurdle FWD/BCKWD and LTL step-overs x multiple reps of each with fading levels of UE support. Rates medium Standing on incline in DF EO x30 sec and EC  x 60 sec with finer-touch support   PATIENT EDUCATION:  Education details: Pt educated throughout session about proper posture and technique with exercises. Improved exercise technique, movement at target joints, use of target muscles after min to mod verbal, visual, tactile cues.   Person educated: Patient Education method: Explanation, Demonstration, Tactile cues, and Verbal cues Education comprehension: verbalized understanding, returned demonstration, verbal cues required, tactile cues required, and needs further education  HOME EXERCISE PROGRAM: Access Code: [email or web address] URL: [email or web address] Date: 07/31/2022 Prepared by: Maureen Ralphs  Exercises - Seated Shoulder Rolls  - 1 x daily - 7 x weekly - 3 sets - 10 reps - Standing Shoulder Row with Anchored Resistance  - 3 x weekly - 3 sets - 10 reps - Supine Chin Tuck  - 1 x daily - 7 x weekly - 3 sets - 10 reps - Shoulder extension with resistance - Neutral  - 1 x daily - 3 x weekly - 3 sets - 10 reps     Access Code: [email or web address] URL: [email or web address] Date: 07/25/2022 Prepared by: Maureen Ralphs  Exercises - Seated Cervical Sidebending Stretch  - 2 x daily - 3 sets - 30 sec hold - Seated Cervical Rotation AROM  - 2 x daily - 3 sets - 10 reps  ASSESSMENT:  CLINICAL IMPRESSION: Pt able to sustain more reps at increased resistance level of UE strengthening and continues to report low levels of neck pain overall, indicating improvement. Pt also showed within-session improvement with dynamic balance tasks following cues, but did struggle with SLB. The pt will benefit from further skilled PT to improve strength, balance, pain and mobility.   OBJECTIVE  IMPAIRMENTS: Abnormal gait, decreased activity tolerance, decreased balance, decreased coordination, decreased endurance, decreased mobility, difficulty walking, decreased ROM, decreased strength, hypomobility, and pain.   ACTIVITY LIMITATIONS: carrying, lifting, bending, standing, squatting, stairs, reach over head, and hygiene/grooming  PARTICIPATION LIMITATIONS: cleaning, laundry, driving, shopping, community activity, and yard work  PERSONAL FACTORS: Age and 1-2 comorbidities: anxiety, HTN  are also affecting patient's functional outcome.   REHAB POTENTIAL: Good  CLINICAL DECISION MAKING: Evolving/moderate complexity  EVALUATION COMPLEXITY: Moderate   GOALS: Goals reviewed with patient? Yes  SHORT TERM GOALS: Target date: 09/05/2022  Pt will be independent with HEP in order to improve strength and decrease back pain in order to improve pain-free function at home and work.    Baseline: EVAL- Patient with no formal HEP in place; 6/24: Pt reports he does his exercises maybe every other day, he is not entirely sure Goal status: IN PROGRESS    LONG TERM GOALS: Target date: 10/17/2022  Pt will decrease worst neck pain as reported on NPRS by at least 2 points in order to demonstrate clinically significant reduction in back pai Baseline: EVAL = 4/10; 6/24: 2-3/10 Goal status: PARTIALLY MET  2.  Pt will  demonstrate decrease in NDI by at least 19% in order to demonstrate clinically significant reduction in disability related to neck injury/pain  Baseline: Eval= 24%; 09/05/22: 22% Goal status: IN PROGRESS  3.  Pt will improve FOTO to target score to display perceived improvements in ability to complete ADL's.  Baseline:  6/24: 58 (previous score 57) Goal status: MET  4.  Pt will improve FGA by at least 4 points in order to demonstrate clinically significant improvement in balance.   Baseline: 15/30; 6/24: 25/30  Goal status: MET  5.  Pt will decrease 5TSTS by at least 3 seconds in  order to demonstrate clinically significant improvement in LE strength. Baseline: 19.48 sec;  6/24: 17.5 sec Goal status: PARTIALLY MET  6.  Pt will increase by at least 0.13 m/s in order to demonstrate clinically significant improvement in community ambulation.   Baseline: EVAL= 0.66 m/s; 6/24: 1.0 m/s  Goal status: MET  7.  Patient will present with > 5 deg improved cervical Rotation with active motion for improved ability turn his head while walking or driving.  Baseline: EVAL- Cervical Rotation L/R= 52/75 deg; 09/03/22: Cervical Rotation L/R = 45/72  Goal status: INITIAL  PLAN:  PT FREQUENCY: 1-2x/week  PT DURATION: 12 weeks  PLANNED INTERVENTIONS: Therapeutic exercises, Therapeutic activity, Neuromuscular re-education, Balance training, Gait training, Patient/Family education, Self Care, Joint mobilization, Joint manipulation, Stair training, Vestibular training, Canalith repositioning, DME instructions, Dry Needling, Electrical stimulation, Spinal manipulation, Spinal mobilization, Cryotherapy, Moist heat, Manual therapy, and Re-evaluation  PLAN FOR NEXT SESSION: Continue with manual therapy for pain relief and ROM of c-spine and balance training for impaired balance, UE strengthening within pain-free ranges   Temple Pacini PT, DPT 09/10/2022, 5:33 PM

## 2022-09-12 ENCOUNTER — Ambulatory Visit: Payer: Medicare Other

## 2022-09-12 DIAGNOSIS — M542 Cervicalgia: Secondary | ICD-10-CM

## 2022-09-12 DIAGNOSIS — R2681 Unsteadiness on feet: Secondary | ICD-10-CM

## 2022-09-12 NOTE — Therapy (Signed)
OUTPATIENT PHYSICAL THERAPY CERVICAL and BALANCE TREATMENT   Patient Name: Billy Berg MRN: [id number] DOB:02-06-1941, 82 y.o., male Today's Date: 09/12/2022  END OF SESSION:  PT End of Session - 09/12/22 1145     Visit Number 13    Number of Visits 24    Date for PT Re-Evaluation 10/17/22    Progress Note Due on Visit 10    PT Start Time 1146    PT Stop Time 1227    PT Time Calculation (min) 41 min    Equipment Utilized During Treatment Gait belt    Activity Tolerance Patient tolerated treatment well    Behavior During Therapy WFL for tasks assessed/performed                  Past Medical History:  Diagnosis Date   Anxiety    Arthritis    GERD (gastroesophageal reflux disease)    History of kidney stones    Hyperlipidemia    Hypertension    Sleep apnea    Wears hearing aid in both ears    Past Surgical History:  Procedure Laterality Date   CATARACT EXTRACTION W/PHACO Right 07/18/2021   Procedure: CATARACT EXTRACTION PHACO AND INTRAOCULAR LENS PLACEMENT (IOC) RIGHT;  Surgeon: Galen Manila, MD;  Location: [facility];  Service: Ophthalmology;  Laterality: Right;  sleep apnea 8.51 00:48.6   CATARACT EXTRACTION W/PHACO Left 08/01/2021   Procedure: CATARACT EXTRACTION PHACO AND INTRAOCULAR LENS PLACEMENT (IOC) LEFT 8.06 01:02.5;  Surgeon: Galen Manila, MD;  Location: [facility];  Service: Ophthalmology;  Laterality: Left;  sleep apnea   COLONOSCOPY     COLONOSCOPY WITH PROPOFOL N/A 10/14/2017   Procedure: COLONOSCOPY WITH PROPOFOL;  Surgeon: Scot Jun, MD;  Location: [facility];  Service: Endoscopy;  Laterality: N/A;   JOINT REPLACEMENT Right 04/2007   KNEE   TONSILLECTOMY     There are no problems to display for this patient.   PCP: Dr Aravind Sites  REFERRING PROVIDER: Dr. Edouard Sites  REFERRING DIAG: Neck pain; Balance disorder  THERAPY DIAG:  Unsteadiness on feet  Cervicalgia  Rationale for Evaluation and  Treatment: Rehabilitation  ONSET DATE: Around 6 months ago (02/2022)  SUBJECTIVE:                                                                                                                                                                                                         SUBJECTIVE STATEMENT: Pt reports neck is feeling pretty good at the moment. Pt reports no falls/stumbles. Pt reports he is not  doing his exercises as often as he should.    Hand dominance: Right  PERTINENT HISTORY:   Patient presents with referral to outpatient PT following appt with Dr. Graciela Husbands for annual exam- including some neck and back pain and imbalance.   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  COLONOSCOPY 12/03/2013  Four 1-5 mm adenomas, sigmoid diverticulosis, internal hemorrhoids; repeat in 11/2016.  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 Yes: NPRS scale: 3-4/10 Pain location: Left upper trap region Pain description: ache Aggravating factors: lifting/driving, turning my head Relieving factors: rest, meds, occasional use of heat  PRECAUTIONS: Fall  WEIGHT BEARING RESTRICTIONS: No  FALLS:  Has patient fallen in last 6 months? Yes. Number of falls 2  LIVING ENVIRONMENT: Lives with: lives with their spouse Lives in: House/apartment Stairs: Yes: Internal: 12 steps; can reach both Has following equipment at home: None  OCCUPATION: Retired Software engineer  PLOF: Independent  PATIENT GOALS: To have improved flexibility in my neck and improve my balance  NEXT MD VISIT: none  OBJECTIVE:    DIAGNOSTIC FINDINGS:  None recent  PATIENT SURVEYS:  NDI 24% indicating mild disability FOTO 42  COGNITION: Overall cognitive status: Within functional limits for tasks assessed- does revert to wife at times to answer questions  SENSATION: WFL with all 4 extremities  POSTURE: rounded shoulders and forward head  PALPATION: (+) tenderness along left Upper trap region with taught bands identified   CERVICAL ROM:   Active ROM A/PROM (deg) eval  Flexion 45  Extension 68  Right lateral flexion 18*  Left lateral flexion 26*  Right rotation 75*  Left rotation 52*   (Blank rows = not tested)    UPPER EXTREMITY MMT:  MMT Right eval Left eval  Shoulder flexion 5 4  Shoulder extension NT 4  Shoulder abduction 5 4  Shoulder adduction 5 4  Shoulder internal rotation 5 4  Shoulder external rotation 5 4  Elbow flexion 5 4+  Elbow extension 5 4+   (Blank rows = not tested)  Strength R/L 4/4 Hip flexion 4/4 Hip external rotation 4/4 Hip internal rotation 4/4 Hip extension  4/4 Hip abduction 5/5 Hip adduction 4/4 Knee extension 5/5 Knee flexion NT- Ankle Plantarflexion 5/5 Ankle Dorsiflexion     CERVICAL SPECIAL TESTS:  Spurling's test: Negative   Gait - Wide base of support- decreased step length- waddle gait pattern approx 120 feet total.   FUNCTIONAL TESTS:  5 times sit to stand: 19.48 10 meter walk test: 15.10sec or 0.66 m/s Functional gait assessment: 15/30  TODAY'S TREATMENT:  DATE: 09/12/22   NMR: In // bars- Tandem stance 2x30 sec each LE  On airex: Alt LE march x multiple reps each LE Standing NBOS EO x 60 sec Standing NBOS EC x 60 sec  Semi-tandem 2x30 sec each LE Standing with horizontal and vertical head turns 10x for each  Cone taps x multiple reps each LE and with fading levels of UE support  One foot  floor, one foot on 6" step 2x30 sec for each LE   -Pt then progressed to perform each LE position with vertical and then horizontal head turns x10 reps of each  Orange hurdle, obstacle clearance, FWD/BCKWD and LTL stepping with fading levels of UE support, multiple reps of each, FWD/BCKWD most challenging Standing on half-bolster for ankle strategies 2x30 sec  Standing slow march near support surface 4x10 ft  In // bars Gait with horizontal head turns 6x length of bars - challenging   PATIENT EDUCATION:  Education details: Pt educated throughout session about proper posture and technique with exercises. Improved exercise technique, movement at target joints, use of target muscles after min to mod verbal, visual, tactile cues.   Person educated: Patient Education method: Explanation, Demonstration, Tactile cues, and Verbal cues Education comprehension: verbalized understanding, returned demonstration, verbal cues required, tactile cues required, and needs further education  HOME EXERCISE PROGRAM: Access Code: [email or web address] URL: [email or web address] Date: 07/31/2022 Prepared by: Maureen Ralphs  Exercises - Seated Shoulder Rolls  - 1 x daily - 7 x weekly - 3 sets - 10 reps - Standing Shoulder Row with Anchored Resistance  - 3 x weekly - 3 sets - 10 reps - Supine Chin Tuck  - 1 x daily - 7 x weekly - 3 sets - 10 reps - Shoulder extension with resistance - Neutral  - 1 x daily - 3 x weekly - 3 sets - 10 reps     Access Code: [email or web address] URL: [email or web address] Date: 07/25/2022 Prepared by: Maureen Ralphs  Exercises - Seated Cervical Sidebending Stretch  - 2 x daily - 3 sets - 30 sec hold - Seated Cervical Rotation AROM  - 2 x daily - 3 sets - 10 reps  ASSESSMENT:  CLINICAL IMPRESSION: Focus of session today was on balance interventions. Multiple balance exercises also incorporated movements with head turns for additional benefit of improving  cervical mobility. Pt continues to have difficulty with SLB tasks, including all progressions. Will continue to target this deficit future visits.  The pt will benefit from further skilled PT to improve strength, balance, pain and mobility.   OBJECTIVE IMPAIRMENTS: Abnormal gait, decreased activity tolerance, decreased balance, decreased coordination, decreased endurance, decreased mobility, difficulty walking, decreased ROM, decreased strength, hypomobility, and pain.   ACTIVITY LIMITATIONS: carrying, lifting, bending, standing, squatting, stairs, reach over head, and hygiene/grooming  PARTICIPATION LIMITATIONS: cleaning, laundry, driving, shopping, community activity, and yard work  PERSONAL FACTORS: Age and 1-2 comorbidities: anxiety, HTN  are also affecting patient's functional outcome.   REHAB POTENTIAL: Good  CLINICAL DECISION MAKING: Evolving/moderate complexity  EVALUATION COMPLEXITY: Moderate   GOALS: Goals reviewed with patient? Yes  SHORT TERM GOALS: Target date: 09/05/2022  Pt will be independent with HEP in order to improve strength and decrease back pain in order to improve pain-free function at home and work.    Baseline: EVAL- Patient with no formal HEP in place; 6/24: Pt reports he does his exercises maybe every other day, he is not entirely sure Goal status: IN PROGRESS    LONG TERM GOALS: Target date:  10/17/2022  Pt will decrease worst neck pain as reported on NPRS by at least 2 points in order to demonstrate clinically significant reduction in back pai Baseline: EVAL = 4/10; 6/24: 2-3/10 Goal status: PARTIALLY MET  2.  Pt will demonstrate decrease in NDI by at least 19% in order to demonstrate clinically significant reduction in disability related to neck injury/pain  Baseline: Eval= 24%; 09/05/22: 22% Goal status: IN PROGRESS  3.  Pt will improve FOTO to target score to display perceived improvements in ability to complete ADL's.  Baseline:  6/24: 58 (previous  score 57) Goal status: MET  4.  Pt will improve FGA by at least 4 points in order to demonstrate clinically significant improvement in balance.   Baseline: 15/30; 6/24: 25/30  Goal status: MET  5.  Pt will decrease 5TSTS by at least 3 seconds in order to demonstrate clinically significant improvement in LE strength. Baseline: 19.48 sec;  6/24: 17.5 sec Goal status: PARTIALLY MET  6.  Pt will increase by at least 0.13 m/s in order to demonstrate clinically significant improvement in community ambulation.   Baseline: EVAL= 0.66 m/s; 6/24: 1.0 m/s  Goal status: MET  7.  Patient will present with > 5 deg improved cervical Rotation with active motion for improved ability turn his head while walking or driving.  Baseline: EVAL- Cervical Rotation L/R= 52/75 deg; 09/03/22: Cervical Rotation L/R = 45/72  Goal status: INITIAL  PLAN:  PT FREQUENCY: 1-2x/week  PT DURATION: 12 weeks  PLANNED INTERVENTIONS: Therapeutic exercises, Therapeutic activity, Neuromuscular re-education, Balance training, Gait training, Patient/Family education, Self Care, Joint mobilization, Joint manipulation, Stair training, Vestibular training, Canalith repositioning, DME instructions, Dry Needling, Electrical stimulation, Spinal manipulation, Spinal mobilization, Cryotherapy, Moist heat, Manual therapy, and Re-evaluation  PLAN FOR NEXT SESSION: Continue with manual therapy for pain relief and ROM of c-spine and balance training for impaired balance, UE strengthening within pain-free ranges   Temple Pacini PT, DPT 09/12/2022, 3:32 PM

## 2022-09-17 ENCOUNTER — Ambulatory Visit: Payer: Medicare Other

## 2022-09-17 DIAGNOSIS — G8929 Other chronic pain: Secondary | ICD-10-CM

## 2022-09-17 DIAGNOSIS — M6281 Muscle weakness (generalized): Secondary | ICD-10-CM

## 2022-09-17 DIAGNOSIS — M542 Cervicalgia: Secondary | ICD-10-CM

## 2022-09-17 DIAGNOSIS — M25612 Stiffness of left shoulder, not elsewhere classified: Secondary | ICD-10-CM

## 2022-09-17 DIAGNOSIS — M5382 Other specified dorsopathies, cervical region: Secondary | ICD-10-CM

## 2022-09-17 NOTE — Therapy (Signed)
OUTPATIENT PHYSICAL THERAPY CERVICAL and BALANCE TREATMENT   Patient Name: Billy Berg MRN: [id number] DOB:07-29-1940, 82 y.o., male Today's Date: 09/17/2022  END OF SESSION:  PT End of Session - 09/17/22 1707     Visit Number 14    Number of Visits 24    Date for PT Re-Evaluation 10/17/22    Progress Note Due on Visit 10    PT Start Time 1617    PT Stop Time 1700    PT Time Calculation (min) 43 min    Equipment Utilized During Treatment Gait belt    Activity Tolerance Patient tolerated treatment well;Patient limited by pain    Behavior During Therapy WFL for tasks assessed/performed                  Past Medical History:  Diagnosis Date   Anxiety    Arthritis    GERD (gastroesophageal reflux disease)    History of kidney stones    Hyperlipidemia    Hypertension    Sleep apnea    Wears hearing aid in both ears    Past Surgical History:  Procedure Laterality Date   CATARACT EXTRACTION W/PHACO Right 07/18/2021   Procedure: CATARACT EXTRACTION PHACO AND INTRAOCULAR LENS PLACEMENT (IOC) RIGHT;  Surgeon: Galen Manila, MD;  Location: [facility];  Service: Ophthalmology;  Laterality: Right;  sleep apnea 8.51 00:48.6   CATARACT EXTRACTION W/PHACO Left 08/01/2021   Procedure: CATARACT EXTRACTION PHACO AND INTRAOCULAR LENS PLACEMENT (IOC) LEFT 8.06 01:02.5;  Surgeon: Galen Manila, MD;  Location: [facility];  Service: Ophthalmology;  Laterality: Left;  sleep apnea   COLONOSCOPY     COLONOSCOPY WITH PROPOFOL N/A 10/14/2017   Procedure: COLONOSCOPY WITH PROPOFOL;  Surgeon: Scot Jun, MD;  Location: [facility];  Service: Endoscopy;  Laterality: N/A;   JOINT REPLACEMENT Right 04/2007   KNEE   TONSILLECTOMY     There are no problems to display for this patient.   PCP: Dr Braelin Sites  REFERRING PROVIDER: Dr. Muhsin Sites  REFERRING DIAG: Neck pain; Balance disorder  THERAPY DIAG:  Cervicalgia  Chronic left shoulder  pain  Muscle weakness (generalized)  Limited active range of motion (AROM) of cervical spine on lateral flexion to left  Stiffness of left shoulder, not elsewhere classified  Rationale for Evaluation and Treatment: Rehabilitation  ONSET DATE: Around 6 months ago (02/2022)  SUBJECTIVE:  SUBJECTIVE STATEMENT: Pt reports neck pain is 2-3/10. He reports no falls/stumbles. No other updates or concerns reported. Hand dominance: Right  PERTINENT HISTORY:   Patient presents with referral to outpatient PT following appt with Dr. Graciela Husbands for annual exam- including some neck and back pain and imbalance.   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  COLONOSCOPY 12/03/2013  Four 1-5 mm adenomas, sigmoid diverticulosis, internal hemorrhoids; repeat in 11/2016.  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 Yes: NPRS scale: 3-4/10 Pain location: Left upper trap region Pain description: ache Aggravating factors: lifting/driving, turning my head Relieving factors: rest, meds, occasional use of heat  PRECAUTIONS: Fall  WEIGHT BEARING RESTRICTIONS: No  FALLS:  Has patient fallen in last 6 months? Yes. Number of falls 2  LIVING ENVIRONMENT: Lives with: lives with their spouse Lives in: House/apartment Stairs: Yes: Internal: 12 steps; can reach both Has following equipment at home: None  OCCUPATION: Retired Government social research officer  PLOF: Independent  PATIENT GOALS: To have improved flexibility in my neck and improve my balance  NEXT MD VISIT: none  OBJECTIVE:   DIAGNOSTIC FINDINGS:  None recent  PATIENT SURVEYS:  NDI 24% indicating mild disability FOTO 22  COGNITION: Overall cognitive status: Within functional limits for tasks assessed- does revert to wife at times to answer questions  SENSATION: WFL with all 4 extremities  POSTURE: rounded shoulders and forward head  PALPATION: (+) tenderness along left Upper trap region with taught bands identified   CERVICAL ROM:   Active ROM A/PROM (deg) eval  Flexion 45  Extension 68  Right lateral flexion 18*  Left lateral flexion 26*  Right rotation 75*  Left rotation 52*   (Blank rows = not tested)    UPPER EXTREMITY MMT:  MMT Right eval Left eval  Shoulder flexion 5 4  Shoulder extension NT 4  Shoulder abduction 5 4  Shoulder adduction 5 4  Shoulder internal rotation 5 4  Shoulder external rotation 5 4  Elbow flexion 5 4+  Elbow extension 5 4+   (Blank rows = not tested)  Strength R/L 4/4 Hip flexion 4/4 Hip external rotation 4/4 Hip internal rotation 4/4 Hip extension  4/4 Hip abduction 5/5 Hip adduction 4/4 Knee extension 5/5 Knee flexion NT- Ankle Plantarflexion 5/5 Ankle Dorsiflexion     CERVICAL SPECIAL TESTS:  Spurling's test: Negative   Gait - Wide base of support- decreased step length- waddle gait pattern approx 120 feet total.   FUNCTIONAL TESTS:  5 times sit to stand: 19.48 10 meter walk test: 15.10sec or 0.66 m/s Functional gait assessment: 15/30  TODAY'S TREATMENT:  DATE: 09/17/22  TE: Cervical rotation stretch 2x30 sec each side UT stretch 2x30 sec each side  Rhomboids stretch 1x60 sec   Rows at cable machine: bilateral 7.5# 1x10, 12.5# 2x10. Pt rates  medium Seated thoracic ext over chair backrest 12x   Sci fit level 4 x 4 min FWD/BCKWD (2 min each). PT provides instruction in technique and monitors pt throughout for response to intervention   Isometrics: cervical lateral flexion 10x each way, extension 10x. Pt with good strength, no pain   Shrugs 10x bilat, 10x unilateral. Focus on coordination, pt tendency to use elbow flexors as compensatory movement --attempted with holding 9# dumbbell each UE x multiple times, still with difficulty maintaining correct technique  Seated shoulder ER (towel rolls under each arm for cue), no resistance due to pain L shoulder with attempting to use t.band 2x10 bilat   Wall push-up 10x   PATIENT EDUCATION:  Education details: Pt educated throughout session about proper posture and technique with exercises. Improved exercise technique, movement at target joints, use of target muscles after min to mod verbal, visual, tactile cues.   Person educated: Patient Education method: Explanation, Demonstration, Tactile cues, and Verbal cues Education comprehension: verbalized understanding, returned demonstration, verbal cues required, tactile cues required, and needs further education  HOME EXERCISE PROGRAM: Access Code: [email or web address] URL: [email or web address] Date: 07/31/2022 Prepared by: Maureen Ralphs  Exercises - Seated Shoulder Rolls  - 1 x daily - 7 x weekly - 3 sets - 10 reps - Standing Shoulder Row with Anchored Resistance  - 3 x weekly - 3 sets - 10 reps - Supine Chin Tuck  - 1 x daily - 7 x weekly - 3 sets - 10 reps - Shoulder extension with resistance - Neutral  - 1 x daily - 3 x weekly - 3 sets - 10 reps     Access Code: [email or web address] URL: [email or web address] Date: 07/25/2022 Prepared by: Maureen Ralphs  Exercises - Seated Cervical Sidebending Stretch  - 2 x daily - 3 sets - 30 sec hold - Seated Cervical Rotation AROM  - 2 x daily - 3 sets - 10  reps  ASSESSMENT:  CLINICAL IMPRESSION: Pt completes cervical and UE therex this visit. Pt continues to exhibit limitations to L side with cervical motion. He reports pain-limitation with RTC strengthening on L side (with hx of old RTC injury), this is likely contributing to continued L side symptoms. He had to complete shoulder ER without resistance as a result. PT instructed pt in progressing such interventions in pain-free or limited ranges. Discussed monitoring for progress over next 4-6 weeks. Pt and spouse verbalized understanding.  The pt will benefit from further skilled PT to improve strength, balance, pain and mobility.   OBJECTIVE IMPAIRMENTS: Abnormal gait, decreased activity tolerance, decreased balance, decreased coordination, decreased endurance, decreased mobility, difficulty walking, decreased ROM, decreased strength, hypomobility, and pain.   ACTIVITY LIMITATIONS: carrying, lifting, bending, standing, squatting, stairs, reach over head, and hygiene/grooming  PARTICIPATION LIMITATIONS: cleaning, laundry, driving, shopping, community activity, and yard work  PERSONAL FACTORS: Age and 1-2 comorbidities: anxiety, HTN  are also affecting patient's functional outcome.   REHAB POTENTIAL: Good  CLINICAL DECISION MAKING: Evolving/moderate complexity  EVALUATION COMPLEXITY: Moderate   GOALS: Goals reviewed with patient? Yes  SHORT TERM GOALS: Target date: 09/05/2022  Pt will be independent with HEP in order to improve strength and decrease back pain in order to improve pain-free function at home and work.    Baseline: EVAL- Patient with no formal HEP  in place; 6/24: Pt reports he does his exercises maybe every other day, he is not entirely sure Goal status: IN PROGRESS    LONG TERM GOALS: Target date: 10/17/2022  Pt will decrease worst neck pain as reported on NPRS by at least 2 points in order to demonstrate clinically significant reduction in back pai Baseline: EVAL =  4/10; 6/24: 2-3/10 Goal status: PARTIALLY MET  2.  Pt will demonstrate decrease in NDI by at least 19% in order to demonstrate clinically significant reduction in disability related to neck injury/pain  Baseline: Eval= 24%; 09/05/22: 22% Goal status: IN PROGRESS  3.  Pt will improve FOTO to target score to display perceived improvements in ability to complete ADL's.  Baseline:  6/24: 58 (previous score 57) Goal status: MET  4.  Pt will improve FGA by at least 4 points in order to demonstrate clinically significant improvement in balance.   Baseline: 15/30; 6/24: 25/30  Goal status: MET  5.  Pt will decrease 5TSTS by at least 3 seconds in order to demonstrate clinically significant improvement in LE strength. Baseline: 19.48 sec;  6/24: 17.5 sec Goal status: PARTIALLY MET  6.  Pt will increase by at least 0.13 m/s in order to demonstrate clinically significant improvement in community ambulation.   Baseline: EVAL= 0.66 m/s; 6/24: 1.0 m/s  Goal status: MET  7.  Patient will present with > 5 deg improved cervical Rotation with active motion for improved ability turn his head while walking or driving.  Baseline: EVAL- Cervical Rotation L/R= 52/75 deg; 09/03/22: Cervical Rotation L/R = 45/72  Goal status: INITIAL  PLAN:  PT FREQUENCY: 1-2x/week  PT DURATION: 12 weeks  PLANNED INTERVENTIONS: Therapeutic exercises, Therapeutic activity, Neuromuscular re-education, Balance training, Gait training, Patient/Family education, Self Care, Joint mobilization, Joint manipulation, Stair training, Vestibular training, Canalith repositioning, DME instructions, Dry Needling, Electrical stimulation, Spinal manipulation, Spinal mobilization, Cryotherapy, Moist heat, Manual therapy, and Re-evaluation  PLAN FOR NEXT SESSION: Continue with manual therapy for pain relief and ROM of c-spine and balance training for impaired balance, UE strengthening within pain-free ranges   Temple Pacini PT,  DPT 09/17/2022, 5:11 PM

## 2022-09-19 ENCOUNTER — Ambulatory Visit: Payer: Medicare Other

## 2022-09-19 DIAGNOSIS — G8929 Other chronic pain: Secondary | ICD-10-CM

## 2022-09-19 DIAGNOSIS — M542 Cervicalgia: Secondary | ICD-10-CM | POA: Diagnosis not present

## 2022-09-19 DIAGNOSIS — M6281 Muscle weakness (generalized): Secondary | ICD-10-CM

## 2022-09-19 DIAGNOSIS — M5382 Other specified dorsopathies, cervical region: Secondary | ICD-10-CM

## 2022-09-19 NOTE — Therapy (Signed)
OUTPATIENT PHYSICAL THERAPY CERVICAL and BALANCE TREATMENT   Patient Name: Billy Berg MRN: [id number] DOB:1940/09/06, 82 y.o., male Today's Date: 09/19/2022  END OF SESSION:  PT End of Session - 09/19/22 1102     Visit Number 15    Number of Visits 24    Date for PT Re-Evaluation 10/17/22    Progress Note Due on Visit 10    PT Start Time 1102    PT Stop Time 1143    PT Time Calculation (min) 41 min    Equipment Utilized During Treatment Gait belt    Activity Tolerance Patient tolerated treatment well;Patient limited by pain    Behavior During Therapy WFL for tasks assessed/performed                  Past Medical History:  Diagnosis Date   Anxiety    Arthritis    GERD (gastroesophageal reflux disease)    History of kidney stones    Hyperlipidemia    Hypertension    Sleep apnea    Wears hearing aid in both ears    Past Surgical History:  Procedure Laterality Date   CATARACT EXTRACTION W/PHACO Right 07/18/2021   Procedure: CATARACT EXTRACTION PHACO AND INTRAOCULAR LENS PLACEMENT (IOC) RIGHT;  Surgeon: Galen Manila, MD;  Location: [facility];  Service: Ophthalmology;  Laterality: Right;  sleep apnea 8.51 00:48.6   CATARACT EXTRACTION W/PHACO Left 08/01/2021   Procedure: CATARACT EXTRACTION PHACO AND INTRAOCULAR LENS PLACEMENT (IOC) LEFT 8.06 01:02.5;  Surgeon: Galen Manila, MD;  Location: [facility];  Service: Ophthalmology;  Laterality: Left;  sleep apnea   COLONOSCOPY     COLONOSCOPY WITH PROPOFOL N/A 10/14/2017   Procedure: COLONOSCOPY WITH PROPOFOL;  Surgeon: Scot Jun, MD;  Location: [facility];  Service: Endoscopy;  Laterality: N/A;   JOINT REPLACEMENT Right 04/2007   KNEE   TONSILLECTOMY     There are no problems to display for this patient.   PCP: Dr Ankur Sites  REFERRING PROVIDER: Dr. Kamarius Sites  REFERRING DIAG: Neck pain; Balance disorder  THERAPY DIAG:  Cervicalgia  Muscle weakness  (generalized)  Chronic left shoulder pain  Limited active range of motion of cervical spine on rotation to right  Limited active range of motion (AROM) of cervical spine on lateral flexion to left  Rationale for Evaluation and Treatment: Rehabilitation  ONSET DATE: Around 6 months ago (02/2022)  SUBJECTIVE:  SUBJECTIVE STATEMENT: Pt reports his neck is really bothering him a lot currently. Pt rates cervical pain as 3.5/10. Pt reports no falls.  Pt spouse reports pt was dizzy this morning. Pt spouse reports pt has dizziness periodically when he gets up but not every day. Hand dominance: Right  PERTINENT HISTORY:   Patient presents with referral to outpatient PT following appt with Dr. Graciela Husbands for annual exam- including some neck and back pain and imbalance.   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  COLONOSCOPY 12/03/2013  Four 1-5 mm adenomas, sigmoid diverticulosis, internal hemorrhoids; repeat in 11/2016.  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 Yes: NPRS scale: 3-4/10 Pain location: Left upper trap region Pain description: ache Aggravating factors: lifting/driving, turning my head Relieving factors: rest, meds, occasional use of heat  PRECAUTIONS: Fall  WEIGHT BEARING RESTRICTIONS: No  FALLS:  Has patient fallen in last 6 months? Yes. Number of falls 2  LIVING ENVIRONMENT: Lives with: lives  with their spouse Lives in: House/apartment Stairs: Yes: Internal: 12 steps; can reach both Has following equipment at home: None  OCCUPATION: Retired Software engineer  PLOF: Independent  PATIENT GOALS: To have improved flexibility in my neck and improve my balance  NEXT MD VISIT: none  OBJECTIVE:   DIAGNOSTIC FINDINGS:  None recent  PATIENT SURVEYS:  NDI 24% indicating mild disability FOTO 70  COGNITION: Overall cognitive status: Within functional limits for tasks assessed- does revert to wife at times to answer questions  SENSATION: WFL with all 4 extremities  POSTURE: rounded shoulders and forward head  PALPATION: (+) tenderness along left Upper trap region with taught bands identified   CERVICAL ROM:   Active ROM A/PROM (deg) eval  Flexion 45  Extension 68  Right lateral flexion 18*  Left lateral flexion 26*  Right rotation 75*  Left rotation 52*   (Blank rows = not tested)    UPPER EXTREMITY MMT:  MMT Right eval Left eval  Shoulder flexion 5 4  Shoulder extension NT 4  Shoulder abduction 5 4  Shoulder adduction 5 4  Shoulder internal rotation 5 4  Shoulder external rotation 5 4  Elbow flexion 5 4+  Elbow extension 5 4+   (Blank rows = not tested)  Strength R/L 4/4 Hip flexion 4/4 Hip external rotation 4/4 Hip internal rotation 4/4 Hip extension  4/4 Hip abduction 5/5 Hip adduction 4/4 Knee extension 5/5 Knee flexion NT- Ankle Plantarflexion 5/5 Ankle Dorsiflexion     CERVICAL SPECIAL TESTS:  Spurling's test: Negative   Gait - Wide base of support- decreased step length- waddle gait pattern approx 120 feet total.   FUNCTIONAL TESTS:  5 times sit to stand: 19.48 10 meter walk test: 15.10sec or 0.66 m/s Functional gait assessment: 15/30  TODAY'S TREATMENT:  DATE:  09/19/22  TE:  BP: Seated: 140/54 mmHg HR 71  Standing: 147/59 mmHg HR 75  Comments: no symptoms   Cervical rotation stretch 1x60 sec each way UT stretch 1x60 sec sec each side  Rhomboids stretch 1x60 sec   Seated thoracic ext over chair backrest 10x. Pt reports feeling like neck "loosened" up a bit.  Rows at cable machine: bilateral 7.5# 2x20  Seated shrugs 15x bilat UE  Seated shoulder ER (towel rolls under each arm for cue) with RTB 12x bilat. Exhibits compensatory elbow ext Seated ER LUE only 12x with PT providing cuing to prevent compensation     Sci fit level 4 x 5 min FWD/BCKWD (2.5 min each). PT provides instruction in technique and monitors pt throughout for response to intervention   Wall push-up 10x   Manual Therapy: Pt seated STM  and TrP release provided to bilateral UT, posterior shoulder mm, and cervical paraspinals - 2 TrPs found with manual therapy in bilat UT x 10 min   PATIENT EDUCATION:  Education details: Pt educated throughout session about proper posture and technique with exercises. Improved exercise technique, movement at target joints, use of target muscles after min to mod verbal, visual, tactile cues.   Person educated: Patient Education method: Explanation, Demonstration, Tactile cues, and Verbal cues Education comprehension: verbalized understanding, returned demonstration, verbal cues required, tactile cues required, and needs further education  HOME EXERCISE PROGRAM: Access Code: [email or web address] URL: [email or web address] Date: 07/31/2022 Prepared by: Maureen Ralphs  Exercises - Seated Shoulder Rolls  - 1 x daily - 7 x weekly - 3 sets - 10 reps - Standing Shoulder Row with Anchored Resistance  - 3 x weekly - 3 sets - 10 reps - Supine Chin Tuck  - 1 x daily - 7 x weekly - 3 sets - 10 reps - Shoulder extension with resistance - Neutral  - 1 x daily - 3 x weekly - 3 sets - 10 reps     Access Code: [email or web address] URL:  [email or web address] Date: 07/25/2022 Prepared by: Maureen Ralphs  Exercises - Seated Cervical Sidebending Stretch  - 2 x daily - 3 sets - 30 sec hold - Seated Cervical Rotation AROM  - 2 x daily - 3 sets - 10 reps  ASSESSMENT:  CLINICAL IMPRESSION: Increased focus today on manual therapy as pt presents with increased cervical pain levels. Following manual and therex pt did report overall improvement in pain. BP also screened as pt spouse reported pt has had periodic dizziness when standing up. BP found to be WNL during visit. The pt will benefit from further skilled PT to improve strength, balance, pain and mobility.   OBJECTIVE IMPAIRMENTS: Abnormal gait, decreased activity tolerance, decreased balance, decreased coordination, decreased endurance, decreased mobility, difficulty walking, decreased ROM, decreased strength, hypomobility, and pain.   ACTIVITY LIMITATIONS: carrying, lifting, bending, standing, squatting, stairs, reach over head, and hygiene/grooming  PARTICIPATION LIMITATIONS: cleaning, laundry, driving, shopping, community activity, and yard work  PERSONAL FACTORS: Age and 1-2 comorbidities: anxiety, HTN  are also affecting patient's functional outcome.   REHAB POTENTIAL: Good  CLINICAL DECISION MAKING: Evolving/moderate complexity  EVALUATION COMPLEXITY: Moderate   GOALS: Goals reviewed with patient? Yes  SHORT TERM GOALS: Target date: 09/05/2022  Pt will be independent with HEP in order to improve strength and decrease back pain in order to improve pain-free function at home and work.    Baseline: EVAL- Patient with no formal HEP in place; 6/24: Pt reports he does his exercises maybe every other  day, he is not entirely sure Goal status: IN PROGRESS    LONG TERM GOALS: Target date: 10/17/2022  Pt will decrease worst neck pain as reported on NPRS by at least 2 points in order to demonstrate clinically significant reduction in back  pai Baseline: EVAL = 4/10; 6/24: 2-3/10 Goal status: PARTIALLY MET  2.  Pt will demonstrate decrease in NDI by at least 19% in order to demonstrate clinically significant reduction in disability related to neck injury/pain  Baseline: Eval= 24%; 09/05/22: 22% Goal status: IN PROGRESS  3.  Pt will improve FOTO to target score to display perceived improvements in ability to complete ADL's.  Baseline:  6/24: 58 (previous score 57) Goal status: MET  4.  Pt will improve FGA by at least 4 points in order to demonstrate clinically significant improvement in balance.   Baseline: 15/30; 6/24: 25/30  Goal status: MET  5.  Pt will decrease 5TSTS by at least 3 seconds in order to demonstrate clinically significant improvement in LE strength. Baseline: 19.48 sec;  6/24: 17.5 sec Goal status: PARTIALLY MET  6.  Pt will increase by at least 0.13 m/s in order to demonstrate clinically significant improvement in community ambulation.   Baseline: EVAL= 0.66 m/s; 6/24: 1.0 m/s  Goal status: MET  7.  Patient will present with > 5 deg improved cervical Rotation with active motion for improved ability turn his head while walking or driving.  Baseline: EVAL- Cervical Rotation L/R= 52/75 deg; 09/03/22: Cervical Rotation L/R = 45/72  Goal status: INITIAL  PLAN:  PT FREQUENCY: 1-2x/week  PT DURATION: 12 weeks  PLANNED INTERVENTIONS: Therapeutic exercises, Therapeutic activity, Neuromuscular re-education, Balance training, Gait training, Patient/Family education, Self Care, Joint mobilization, Joint manipulation, Stair training, Vestibular training, Canalith repositioning, DME instructions, Dry Needling, Electrical stimulation, Spinal manipulation, Spinal mobilization, Cryotherapy, Moist heat, Manual therapy, and Re-evaluation  PLAN FOR NEXT SESSION: Continue with manual therapy for pain relief and ROM of c-spine and balance training for impaired balance, UE strengthening within pain-free  ranges   Temple Pacini PT, DPT 09/19/2022, 12:58 PM

## 2022-09-24 ENCOUNTER — Ambulatory Visit: Payer: Medicare Other

## 2022-09-24 DIAGNOSIS — M542 Cervicalgia: Secondary | ICD-10-CM

## 2022-09-24 DIAGNOSIS — R2681 Unsteadiness on feet: Secondary | ICD-10-CM

## 2022-09-24 DIAGNOSIS — M5382 Other specified dorsopathies, cervical region: Secondary | ICD-10-CM

## 2022-09-24 DIAGNOSIS — R262 Difficulty in walking, not elsewhere classified: Secondary | ICD-10-CM

## 2022-09-24 NOTE — Therapy (Signed)
OUTPATIENT PHYSICAL THERAPY CERVICAL and BALANCE TREATMENT   Patient Name: Billy Berg MRN: [id number] DOB:[birth year], 82 y.o., male Today's Date: 09/24/2022  END OF SESSION:         Past Medical History:  Diagnosis Date   Anxiety    Arthritis    GERD (gastroesophageal reflux disease)    History of kidney stones    Hyperlipidemia    Hypertension    Sleep apnea    Wears hearing aid in both ears    Past Surgical History:  Procedure Laterality Date   CATARACT EXTRACTION W/PHACO Right 07/18/2021   Procedure: CATARACT EXTRACTION PHACO AND INTRAOCULAR LENS PLACEMENT (IOC) RIGHT;  Surgeon: Galen Manila, MD;  Location: [facility];  Service: Ophthalmology;  Laterality: Right;  sleep apnea 8.51 00:48.6   CATARACT EXTRACTION W/PHACO Left 08/01/2021   Procedure: CATARACT EXTRACTION PHACO AND INTRAOCULAR LENS PLACEMENT (IOC) LEFT 8.06 01:02.5;  Surgeon: Galen Manila, MD;  Location: [facility];  Service: Ophthalmology;  Laterality: Left;  sleep apnea   COLONOSCOPY     COLONOSCOPY WITH PROPOFOL N/A 10/14/2017   Procedure: COLONOSCOPY WITH PROPOFOL;  Surgeon: Scot Jun, MD;  Location: [facility];  Service: Endoscopy;  Laterality: N/A;   JOINT REPLACEMENT Right 04/2007   KNEE   TONSILLECTOMY     There are no problems to display for this patient.   PCP: Dr Tamari Sites  REFERRING PROVIDER: Dr. Devone Sites  REFERRING DIAG: Neck pain; Balance disorder  THERAPY DIAG:  No diagnosis found.  Rationale for Evaluation and Treatment: Rehabilitation  ONSET DATE: Around 6 months ago (02/2022)  SUBJECTIVE:                                                                                                                                                                                                         SUBJECTIVE STATEMENT: Pt reports his neck is "really bothering" him. He rates neck pain as 4/10. Hand dominance: Right  PERTINENT HISTORY:    Patient presents with referral to outpatient PT following appt with Dr. Graciela Husbands for annual exam- including some neck and back pain and imbalance.   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  COLONOSCOPY 12/03/2013  Four 1-5 mm adenomas, sigmoid diverticulosis, internal hemorrhoids; repeat in 11/2016.  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 Yes: NPRS scale: 3-4/10 Pain location: Left upper trap region Pain description: ache Aggravating factors: lifting/driving, turning my head Relieving factors: rest, meds, occasional use of heat  PRECAUTIONS: Fall  WEIGHT BEARING RESTRICTIONS: No  FALLS:  Has patient fallen in last 6 months? Yes. Number of falls 2  LIVING ENVIRONMENT: Lives with: lives with their spouse Lives in: House/apartment Stairs: Yes: Internal: 12 steps; can reach both Has following equipment at home: None  OCCUPATION: Retired Software engineer  PLOF: Independent  PATIENT GOALS: To have improved flexibility in my neck and improve my balance  NEXT MD VISIT: none  OBJECTIVE:   DIAGNOSTIC FINDINGS:  None recent  PATIENT SURVEYS:  NDI 24% indicating mild disability FOTO 90  COGNITION: Overall cognitive status: Within functional limits for tasks assessed- does revert to wife at times to answer questions  SENSATION: WFL with all 4 extremities  POSTURE: rounded shoulders and forward head  PALPATION: (+) tenderness along left Upper trap region with taught bands identified   CERVICAL ROM:   Active ROM A/PROM (deg) eval  Flexion 45  Extension 68  Right lateral  flexion 18*  Left lateral flexion 26*  Right rotation 75*  Left rotation 52*   (Blank rows = not tested)    UPPER EXTREMITY MMT:  MMT Right eval Left eval  Shoulder flexion 5 4  Shoulder extension NT 4  Shoulder abduction 5 4  Shoulder adduction 5 4  Shoulder internal rotation 5 4  Shoulder external rotation 5 4  Elbow flexion 5 4+  Elbow extension 5 4+   (Blank rows = not tested)  Strength R/L 4/4 Hip flexion 4/4 Hip external rotation 4/4 Hip internal rotation 4/4 Hip extension  4/4 Hip abduction 5/5 Hip adduction 4/4 Knee extension 5/5 Knee flexion NT- Ankle Plantarflexion 5/5 Ankle Dorsiflexion     CERVICAL SPECIAL TESTS:  Spurling's test: Negative   Gait - Wide base of support- decreased step length- waddle gait pattern approx 120 feet total.   FUNCTIONAL TESTS:  5 times sit to stand: 19.48 10 meter walk test: 15.10sec or 0.66 m/s Functional gait assessment: 15/30  TODAY'S TREATMENT:                                                                                                                              DATE: 09/24/22   Manual Therapy: Pt seated STM  and TrP release provided to bilateral UT, posterior shoulder mm, and cervical paraspinals - x 14 min Continued prominent TrPs in bilta UT and one near L lower cervical paraspinals.  TE: AROM: cervical rotation and lateral flexion 10x each way  Cervical rotation stretch 1x60 sec each side UT stretch 1x60 sec sec each side - PT provides hands-on assist to deep L side stretch  Rhomboids stretch 1x60 sec   AROM cervical rotation 10x each way  NMR: In // bars- Tandem stance 2x30 sec each LE - intermittent UE support Tandem gait 8x length of bars - intermittent UE support, rates medium   On airex: -Alt LE march 2x20 - difficulty controlling speed -Standing NBOS EC 4x30 sec   One foot on airex, one foot on 6" step 30 sec each LE position --Pt then progressed to performing 10 horizontal head  turns each way in each LE position --Pt then progressed to performing 10 vertical head turns each way in each LE position     PATIENT EDUCATION:  Education details: Pt educated throughout session about proper posture and technique with exercises. Improved exercise technique, movement at target joints, use of target muscles after min to mod verbal, visual, tactile cues.   Person educated: Patient Education method: Explanation, Demonstration, Tactile cues, and Verbal cues Education comprehension: verbalized understanding, returned demonstration, verbal cues required, tactile cues required, and needs further education  HOME EXERCISE PROGRAM: Access Code: [email or web address] URL: [email or web address] Date: 07/31/2022 Prepared by: Maureen Ralphs  Exercises - Seated Shoulder Rolls  - 1 x daily - 7 x weekly - 3 sets - 10 reps - Standing Shoulder Row with Anchored Resistance  - 3 x weekly - 3 sets - 10 reps - Supine Chin Tuck  - 1 x daily - 7 x weekly - 3 sets - 10 reps - Shoulder extension with resistance - Neutral  - 1 x daily - 3 x weekly - 3 sets - 10 reps     Access Code: [email or web address] URL: [email or web address] Date: 07/25/2022 Prepared by: Maureen Ralphs  Exercises - Seated Cervical Sidebending Stretch  - 2 x daily - 3 sets - 30 sec hold - Seated Cervical Rotation AROM  - 2 x daily - 3 sets - 10 reps  ASSESSMENT:  CLINICAL IMPRESSION: Pt continues to report increased neck pain. Pt with reports of both improvement and increased symptoms with manual therapy to region. PT did discuss with pt that it may soon be appropriate to follow up with MD again regarding pain if he does not experience improvement within next few visits. Pt and spouse verbalized understanding. Remaining part of session focused on balance interventions. Pt generally required intermittent UE throughout to correct for LOB. The pt will benefit from further skilled PT to improve strength,  balance, pain and mobility.   OBJECTIVE IMPAIRMENTS: Abnormal gait, decreased activity tolerance, decreased balance, decreased coordination, decreased endurance, decreased mobility, difficulty walking, decreased ROM, decreased strength, hypomobility, and pain.   ACTIVITY LIMITATIONS: carrying, lifting, bending, standing, squatting, stairs, reach over head, and hygiene/grooming  PARTICIPATION LIMITATIONS: cleaning, laundry, driving, shopping, community activity, and yard work  PERSONAL FACTORS: Age and 1-2 comorbidities: anxiety, HTN  are also affecting patient's functional outcome.   REHAB POTENTIAL: Good  CLINICAL DECISION MAKING: Evolving/moderate complexity  EVALUATION COMPLEXITY: Moderate   GOALS: Goals reviewed with patient? Yes  SHORT TERM GOALS: Target date: 09/05/2022  Pt will be independent with HEP in order to improve strength and decrease back pain in order to improve pain-free function at home and work.    Baseline: EVAL- Patient with no formal HEP in place; 6/24: Pt reports he does his exercises maybe every other day, he is not entirely sure Goal status: IN PROGRESS    LONG TERM GOALS: Target date: 10/17/2022  Pt will decrease worst neck pain as reported on NPRS by at least 2 points in order to demonstrate clinically significant reduction in back pai Baseline: EVAL = 4/10; 6/24: 2-3/10 Goal status: PARTIALLY MET  2.  Pt will demonstrate decrease in NDI by at least 19% in order to demonstrate clinically significant reduction in disability related to neck injury/pain  Baseline: Eval= 24%; 09/05/22: 22% Goal status: IN PROGRESS  3.  Pt will improve FOTO to target score to display perceived improvements in ability to complete ADL's.  Baseline:  6/24: 58 (previous score 57) Goal status: MET  4.  Pt will improve FGA by at least 4 points in order to demonstrate clinically significant improvement in balance.   Baseline: 15/30; 6/24: 25/30  Goal status: MET  5.  Pt will  decrease 5TSTS by at least 3 seconds in order to demonstrate clinically significant improvement in LE strength. Baseline: 19.48 sec;  6/24: 17.5 sec Goal status: PARTIALLY MET  6.  Pt will increase by at least 0.13 m/s in order to demonstrate clinically significant improvement in community ambulation.   Baseline: EVAL= 0.66 m/s; 6/24: 1.0 m/s  Goal status: MET  7.  Patient will present with > 5 deg improved cervical Rotation with active motion for improved ability turn his head while walking or driving.  Baseline: EVAL- Cervical Rotation L/R= 52/75 deg; 09/03/22: Cervical Rotation L/R = 45/72  Goal status: INITIAL  PLAN:  PT FREQUENCY: 1-2x/week  PT DURATION: 12 weeks  PLANNED INTERVENTIONS: Therapeutic exercises, Therapeutic activity, Neuromuscular re-education, Balance training, Gait training, Patient/Family education, Self Care, Joint mobilization, Joint manipulation, Stair training, Vestibular training, Canalith repositioning, DME instructions, Dry Needling, Electrical stimulation, Spinal manipulation, Spinal mobilization, Cryotherapy, Moist heat, Manual therapy, and Re-evaluation  PLAN FOR NEXT SESSION: Continue with manual therapy for pain relief and ROM of c-spine and balance training for impaired balance, UE strengthening within pain-free ranges. Continue plan   Temple Pacini PT, DPT 09/24/2022, 3:52 PM

## 2022-09-26 ENCOUNTER — Encounter: Payer: Self-pay | Admitting: Physical Therapy

## 2022-09-26 ENCOUNTER — Ambulatory Visit: Payer: Medicare Other | Admitting: Physical Therapy

## 2022-09-26 ENCOUNTER — Other Ambulatory Visit: Payer: Self-pay | Admitting: Student

## 2022-09-26 DIAGNOSIS — M542 Cervicalgia: Secondary | ICD-10-CM

## 2022-09-26 DIAGNOSIS — R2681 Unsteadiness on feet: Secondary | ICD-10-CM

## 2022-09-26 DIAGNOSIS — R42 Dizziness and giddiness: Secondary | ICD-10-CM

## 2022-09-26 DIAGNOSIS — R262 Difficulty in walking, not elsewhere classified: Secondary | ICD-10-CM

## 2022-09-26 NOTE — Therapy (Signed)
OUTPATIENT PHYSICAL THERAPY CERVICAL and BALANCE TREATMENT   Patient Name: Billy Berg MRN: [id number] DOB:1941/03/07, 82 y.o., male Today's Date: 09/26/2022  END OF SESSION:  PT End of Session - 09/26/22 1359     Visit Number 17    Number of Visits 24    Date for PT Re-Evaluation 10/17/22    Progress Note Due on Visit 10    PT Start Time 1402    PT Stop Time 1443    PT Time Calculation (min) 41 min    Equipment Utilized During Treatment Gait belt    Activity Tolerance Patient tolerated treatment well    Behavior During Therapy WFL for tasks assessed/performed                   Past Medical History:  Diagnosis Date   Anxiety    Arthritis    GERD (gastroesophageal reflux disease)    History of kidney stones    Hyperlipidemia    Hypertension    Sleep apnea    Wears hearing aid in both ears    Past Surgical History:  Procedure Laterality Date   CATARACT EXTRACTION W/PHACO Right 07/18/2021   Procedure: CATARACT EXTRACTION PHACO AND INTRAOCULAR LENS PLACEMENT (IOC) RIGHT;  Surgeon: Galen Manila, MD;  Location: [facility];  Service: Ophthalmology;  Laterality: Right;  sleep apnea 8.51 00:48.6   CATARACT EXTRACTION W/PHACO Left 08/01/2021   Procedure: CATARACT EXTRACTION PHACO AND INTRAOCULAR LENS PLACEMENT (IOC) LEFT 8.06 01:02.5;  Surgeon: Galen Manila, MD;  Location: [facility];  Service: Ophthalmology;  Laterality: Left;  sleep apnea   COLONOSCOPY     COLONOSCOPY WITH PROPOFOL N/A 10/14/2017   Procedure: COLONOSCOPY WITH PROPOFOL;  Surgeon: Scot Jun, MD;  Location: [facility];  Service: Endoscopy;  Laterality: N/A;   JOINT REPLACEMENT Right 04/2007   KNEE   TONSILLECTOMY     There are no problems to display for this patient.   PCP: Dr Anatole Sites  REFERRING PROVIDER: Dr. Townes Sites  REFERRING DIAG: Neck pain; Balance disorder  THERAPY DIAG:  Cervicalgia  Unsteadiness on feet  Difficulty in walking, not  elsewhere classified  Rationale for Evaluation and Treatment: Rehabilitation  ONSET DATE: Around 6 months ago (02/2022)  SUBJECTIVE:                                                                                                                                                                                                         SUBJECTIVE STATEMENT:  He rates neck pain as 2/10 and this is a  pretty good day for his back.  Patient wife reports no significant changes and reports his balance has been looking a lot better. Hand dominance: Right  PERTINENT HISTORY:   Patient presents with referral to outpatient PT following appt with Dr. Graciela Husbands for annual exam- including some neck and back pain and imbalance.   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  COLONOSCOPY 12/03/2013  Four 1-5 mm adenomas, sigmoid diverticulosis, internal hemorrhoids; repeat in 11/2016.  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 Yes: NPRS scale: 3-4/10 Pain location: Left upper trap region Pain description: ache Aggravating factors: lifting/driving, turning my head Relieving factors: rest, meds, occasional use of heat  PRECAUTIONS: Fall  WEIGHT BEARING RESTRICTIONS: No  FALLS:  Has patient fallen in last 6 months? Yes. Number of falls 2  LIVING ENVIRONMENT: Lives with: lives with their spouse Lives in: House/apartment Stairs: Yes: Internal: 12 steps; can reach both Has following equipment at home: None  OCCUPATION: Retired Software engineer  PLOF: Independent  PATIENT GOALS: To have improved flexibility in  my neck and improve my balance  NEXT MD VISIT: none  OBJECTIVE:   DIAGNOSTIC FINDINGS:  None recent  PATIENT SURVEYS:  NDI 24% indicating mild disability FOTO 56  COGNITION: Overall cognitive status: Within functional limits for tasks assessed- does revert to wife at times to answer questions  SENSATION: WFL with all 4 extremities  POSTURE: rounded shoulders and forward head  PALPATION: (+) tenderness along left Upper trap region with taught bands identified   CERVICAL ROM:   Active ROM A/PROM (deg) eval  Flexion 45  Extension 68  Right lateral flexion 18*  Left lateral flexion 26*  Right rotation 75*  Left rotation 52*   (Blank rows = not tested)    UPPER EXTREMITY MMT:  MMT Right eval Left eval  Shoulder flexion 5 4  Shoulder extension NT 4  Shoulder abduction 5 4  Shoulder adduction 5 4  Shoulder internal rotation 5 4  Shoulder external rotation 5 4  Elbow flexion 5 4+  Elbow extension 5 4+   (Blank rows = not tested)  Strength R/L 4/4 Hip flexion 4/4 Hip external rotation 4/4 Hip internal rotation 4/4 Hip extension  4/4 Hip abduction 5/5 Hip adduction 4/4 Knee extension 5/5 Knee flexion NT- Ankle Plantarflexion 5/5 Ankle Dorsiflexion     CERVICAL SPECIAL TESTS:  Spurling's test: Negative   Gait - Wide base of support- decreased step length- waddle gait pattern approx 120 feet total.   FUNCTIONAL TESTS:  5 times sit to stand: 19.48 10 meter walk test: 15.10sec or 0.66 m/s Functional gait assessment: 15/30  TODAY'S TREATMENT:  DATE: 09/26/22   Manual Therapy: Pt seated STM  and TrP release provided to bilateral UT, posterior shoulder mm, and cervical paraspinals - x 15 min Continued prominent TrPs in bilta UT  -uses TRP with AROM to further facilitate Tp release, pt reports improved tighthess on the L>R  side following.   TE: AROM: cervical rotation and lateral flexion 10x each way  BTB rows 2 x 10   GTB hor ABD x 6 then pt has some discomfort in shoulder so this was discontinued, may attempt at decreased resistance or in different position if attempted in future, seemed musculature for this fatigued.    NMR:  In // bars- Tandem stance 2x30 sec each LE - intermittent UE support  On airex: Stepping on / off x 10 ea LE lateral then x 10 ea LE forward and retro stepping  -good balance responses, occasional foot catching.   One foot on airex, one foot on 6" step 30 sec each LE position -10 horizontal head turns each way in each LE position -10 vertical head turns each way in each LE position     PATIENT EDUCATION:  Education details: Pt educated throughout session about proper posture and technique with exercises. Improved exercise technique, movement at target joints, use of target muscles after min to mod verbal, visual, tactile cues.   Person educated: Patient Education method: Explanation, Demonstration, Tactile cues, and Verbal cues Education comprehension: verbalized understanding, returned demonstration, verbal cues required, tactile cues required, and needs further education  HOME EXERCISE PROGRAM: Access Code: [email or web address] URL: [email or web address] Date: 07/31/2022 Prepared by: Maureen Ralphs  Exercises - Seated Shoulder Rolls  - 1 x daily - 7 x weekly - 3 sets - 10 reps - Standing Shoulder Row with Anchored Resistance  - 3 x weekly - 3 sets - 10 reps - Supine Chin Tuck  - 1 x daily - 7 x weekly - 3 sets - 10 reps - Shoulder extension with resistance - Neutral  - 1 x daily - 3 x weekly - 3 sets - 10 reps     Access Code: [email or web address] URL: [email or web address] Date: 07/25/2022 Prepared by: Maureen Ralphs  Exercises - Seated Cervical Sidebending Stretch  - 2 x daily - 3 sets - 30 sec hold - Seated Cervical Rotation AROM  - 2 x  daily - 3 sets - 10 reps  ASSESSMENT:  CLINICAL IMPRESSION:  Patient presents with good motivation for completion of physical therapy activities.  Patient continues to have increased tightness in his upper traps but pain is slightly decreased compared to previous session.  Patient responds well to manual therapy but still has increased pain with higher intensity stretching of his bilateral upper traps.  Patient shows great progress with his balance related activities showing improvement in adapting to compliant surfaces as well as improvement with challenging multiple systems of balance. Pt will continue to benefit from skilled physical therapy intervention to address impairments, improve QOL, and attain therapy goals.     OBJECTIVE IMPAIRMENTS: Abnormal gait, decreased activity tolerance, decreased balance, decreased coordination, decreased endurance, decreased mobility, difficulty walking, decreased ROM, decreased strength, hypomobility, and pain.   ACTIVITY LIMITATIONS: carrying, lifting, bending, standing, squatting, stairs, reach over head, and hygiene/grooming  PARTICIPATION LIMITATIONS: cleaning, laundry, driving, shopping, community activity, and yard work  PERSONAL FACTORS: Age and 1-2 comorbidities: anxiety, HTN  are also affecting patient's functional outcome.   REHAB POTENTIAL: Good  CLINICAL DECISION MAKING: Evolving/moderate complexity  EVALUATION COMPLEXITY: Moderate   GOALS: Goals reviewed with  patient? Yes  SHORT TERM GOALS: Target date: 09/05/2022  Pt will be independent with HEP in order to improve strength and decrease back pain in order to improve pain-free function at home and work.    Baseline: EVAL- Patient with no formal HEP in place; 6/24: Pt reports he does his exercises maybe every other day, he is not entirely sure Goal status: IN PROGRESS    LONG TERM GOALS: Target date: 10/17/2022  Pt will decrease worst neck pain as reported on NPRS by at least 2  points in order to demonstrate clinically significant reduction in back pain Baseline: EVAL = 4/10; 6/24: 2-3/10 Goal status: PARTIALLY MET  2.  Pt will demonstrate decrease in NDI by at least 19% in order to demonstrate clinically significant reduction in disability related to neck injury/pain  Baseline: Eval= 24%; 09/05/22: 22% Goal status: IN PROGRESS  3.  Pt will improve FOTO to target score to display perceived improvements in ability to complete ADL's.  Baseline:  6/24: 58 (previous score 57) Goal status: MET  4.  Pt will improve FGA by at least 4 points in order to demonstrate clinically significant improvement in balance.   Baseline: 15/30; 6/24: 25/30  Goal status: MET  5.  Pt will decrease 5TSTS by at least 3 seconds in order to demonstrate clinically significant improvement in LE strength. Baseline: 19.48 sec;  6/24: 17.5 sec Goal status: PARTIALLY MET  6.  Pt will increase by at least 0.13 m/s in order to demonstrate clinically significant improvement in community ambulation.   Baseline: EVAL= 0.66 m/s; 6/24: 1.0 m/s  Goal status: MET  7.  Patient will present with > 5 deg improved cervical Rotation with active motion for improved ability turn his head while walking or driving.  Baseline: EVAL- Cervical Rotation L/R= 52/75 deg; 09/03/22: Cervical Rotation L/R = 45/72  Goal status: INITIAL  PLAN:  PT FREQUENCY: 1-2x/week  PT DURATION: 12 weeks  PLANNED INTERVENTIONS: Therapeutic exercises, Therapeutic activity, Neuromuscular re-education, Balance training, Gait training, Patient/Family education, Self Care, Joint mobilization, Joint manipulation, Stair training, Vestibular training, Canalith repositioning, DME instructions, Dry Needling, Electrical stimulation, Spinal manipulation, Spinal mobilization, Cryotherapy, Moist heat, Manual therapy, and Re-evaluation  PLAN FOR NEXT SESSION: Continue with manual therapy for pain relief and ROM of c-spine and balance  training for impaired balance, UE strengthening within pain-free ranges. Continue plan  Norman Herrlich PT ,DPT Physical Therapist- [facility]   09/26/2022, 4:03 PM

## 2022-10-01 ENCOUNTER — Other Ambulatory Visit: Payer: Self-pay | Admitting: Student

## 2022-10-01 ENCOUNTER — Ambulatory Visit
Admission: RE | Admit: 2022-10-01 | Discharge: 2022-10-01 | Disposition: A | Discharge status: Home / Self Care | Payer: Medicare Other | Source: Ambulatory Visit | Attending: Student | Admitting: Student

## 2022-10-01 DIAGNOSIS — R42 Dizziness and giddiness: Secondary | ICD-10-CM | POA: Insufficient documentation

## 2022-10-01 DIAGNOSIS — R519 Headache, unspecified: Secondary | ICD-10-CM

## 2022-10-02 ENCOUNTER — Ambulatory Visit: Payer: Medicare Other

## 2022-10-02 DIAGNOSIS — G8929 Other chronic pain: Secondary | ICD-10-CM

## 2022-10-02 DIAGNOSIS — M5382 Other specified dorsopathies, cervical region: Secondary | ICD-10-CM

## 2022-10-02 DIAGNOSIS — M542 Cervicalgia: Secondary | ICD-10-CM

## 2022-10-02 DIAGNOSIS — M25612 Stiffness of left shoulder, not elsewhere classified: Secondary | ICD-10-CM

## 2022-10-02 DIAGNOSIS — M6281 Muscle weakness (generalized): Secondary | ICD-10-CM

## 2022-10-02 NOTE — Therapy (Signed)
OUTPATIENT PHYSICAL THERAPY CERVICAL and BALANCE TREATMENT   Patient Name: Billy Berg MRN: [id number] DOB:[birth year], 82 y.o., male Today's Date: 10/03/2022  END OF SESSION:  PT End of Session - 10/02/22 1447     Visit Number 18    Number of Visits 24    Date for PT Re-Evaluation 10/17/22    Progress Note Due on Visit 10    PT Start Time 1447    PT Stop Time 1530    PT Time Calculation (min) 43 min    Equipment Utilized During Treatment Gait belt    Activity Tolerance Patient tolerated treatment well    Behavior During Therapy WFL for tasks assessed/performed                   Past Medical History:  Diagnosis Date   Anxiety    Arthritis    GERD (gastroesophageal reflux disease)    History of kidney stones    Hyperlipidemia    Hypertension    Sleep apnea    Wears hearing aid in both ears    Past Surgical History:  Procedure Laterality Date   CATARACT EXTRACTION W/PHACO Right 07/18/2021   Procedure: CATARACT EXTRACTION PHACO AND INTRAOCULAR LENS PLACEMENT (IOC) RIGHT;  Surgeon: Galen Manila, MD;  Location: [facility];  Service: Ophthalmology;  Laterality: Right;  sleep apnea 8.51 00:48.6   CATARACT EXTRACTION W/PHACO Left 08/01/2021   Procedure: CATARACT EXTRACTION PHACO AND INTRAOCULAR LENS PLACEMENT (IOC) LEFT 8.06 01:02.5;  Surgeon: Galen Manila, MD;  Location: [facility];  Service: Ophthalmology;  Laterality: Left;  sleep apnea   COLONOSCOPY     COLONOSCOPY WITH PROPOFOL N/A 10/14/2017   Procedure: COLONOSCOPY WITH PROPOFOL;  Surgeon: Scot Jun, MD;  Location: [facility] ENDOSCOPY;  Service: Endoscopy;  Laterality: N/A;   JOINT REPLACEMENT Right 04/2007   KNEE   TONSILLECTOMY     There are no problems to display for this patient.   PCP: Dr Cortavius Sites  REFERRING PROVIDER: Dr. Abrham Sites  REFERRING DIAG: Neck pain; Balance disorder  THERAPY DIAG:  Cervicalgia  Stiffness of left shoulder, not elsewhere  classified  Chronic left shoulder pain  Limited active range of motion (AROM) of cervical spine on lateral flexion to left  Limited active range of motion of cervical spine on rotation to right  Muscle weakness (generalized)  Rationale for Evaluation and Treatment: Rehabilitation  ONSET DATE: Around 6 months ago (02/2022)  SUBJECTIVE:  SUBJECTIVE STATEMENT: Pt rates neck pain is 1.25/10. Pt spouse present and reports he's been doing well. 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 They are awaiting follow-up to determine next steps. Hand dominance: Right  PERTINENT HISTORY:   Patient presents with referral to outpatient PT following appt with Dr. Graciela Husbands for annual exam- including some neck and back pain and imbalance.   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  COLONOSCOPY 12/03/2013  Four 1-5 mm adenomas, sigmoid diverticulosis, internal hemorrhoids; repeat in 11/2016.  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 Yes: NPRS scale: 3-4/10 Pain location: Left upper trap region Pain description: ache Aggravating factors: lifting/driving, turning my head Relieving factors: rest, meds, occasional use of heat  PRECAUTIONS: Fall  WEIGHT BEARING RESTRICTIONS: No  FALLS:  Has patient fallen in last 6 months? Yes. Number of falls 2  LIVING ENVIRONMENT: Lives with:  lives with their spouse Lives in: House/apartment Stairs: Yes: Internal: 12 steps; can reach both Has following equipment at home: None  OCCUPATION: Retired Software engineer  PLOF: Independent  PATIENT GOALS: To have improved flexibility in my neck and improve my balance  NEXT MD VISIT: none  OBJECTIVE:   DIAGNOSTIC FINDINGS:  None recent  PATIENT SURVEYS:  NDI 24% indicating mild disability FOTO 20  COGNITION: Overall cognitive status: Within functional limits for tasks assessed- does revert to wife at times to answer questions  SENSATION: WFL with all 4 extremities  POSTURE: rounded shoulders and forward head  PALPATION: (+) tenderness along left Upper trap region with taught bands identified   CERVICAL ROM:   Active ROM A/PROM (deg) eval  Flexion 45  Extension 68  Right lateral flexion 18*  Left lateral flexion 26*  Right rotation 75*  Left rotation 52*   (Blank rows = not tested)    UPPER EXTREMITY MMT:  MMT Right eval Left eval  Shoulder flexion 5 4  Shoulder extension NT 4  Shoulder abduction 5 4  Shoulder adduction 5 4  Shoulder internal rotation 5 4  Shoulder external rotation 5 4  Elbow flexion 5 4+  Elbow extension 5 4+   (Blank rows = not tested)  Strength R/L 4/4 Hip flexion 4/4 Hip external rotation 4/4 Hip internal rotation 4/4 Hip extension  4/4 Hip abduction 5/5 Hip adduction 4/4 Knee extension 5/5 Knee flexion NT- Ankle Plantarflexion 5/5 Ankle Dorsiflexion     CERVICAL SPECIAL TESTS:  Spurling's test: Negative   Gait - Wide base of support- decreased step length- waddle gait pattern approx 120 feet total.   FUNCTIONAL TESTS:  5 times sit to stand: 19.48 10 meter walk test: 15.10sec or 0.66 m/s Functional gait assessment: 15/30  TODAY'S TREATMENT:  DATE: 10/03/22   Manual  Therapy: Pt seated STM  and TrP release provided to bilateral UT, posterior shoulder mm, and cervical paraspinals - Continued prominent TrPs in L Ut, minor one on R side    TE: AROM: cervical rotation 10x each way  Cervical rotation stretch within pain-free range 2x30 sec each way  BTB rows 1x 12, 1x15 bilat  Shoulder ER AROM bilat 10x. Mildly painful on L, discontinued.  Elbows fully flexed, bilat shoulder abduction 10x. Still pain limited on L side.  Discontinued  SciFIT UE/shoulder strengthening LVL 4 x 2 min FWD/2 min BCKWD. Cuing for performance of task/technique, PT monitored pt for pain response  Shrugs 10x bilat  Shrugs with UT stretch multiple reps each side  Shoulder rolls CW/CC 10x each direction   Shoulder isometrics (LUE only) flexion/extension 10x with 3 sec hold/rep   PATIENT EDUCATION:  Education details: Pt educated throughout session about proper posture and technique with exercises. Improved exercise technique, movement at target joints, use of target muscles after min to mod verbal, visual, tactile cues.   Person educated: Patient Education method: Explanation, Demonstration, Tactile cues, and Verbal cues Education comprehension: verbalized understanding, returned demonstration, verbal cues required, tactile cues required, and needs further education  HOME EXERCISE PROGRAM: Access Code: [email or web address] URL: [email or web address] Date: 07/31/2022 Prepared by: Maureen Ralphs  Exercises - Seated Shoulder Rolls  - 1 x daily - 7 x weekly - 3 sets - 10 reps - Standing Shoulder Row with Anchored Resistance  - 3 x weekly - 3 sets - 10 reps - Supine Chin Tuck  - 1 x daily - 7 x weekly - 3 sets - 10 reps - Shoulder extension with resistance - Neutral  - 1 x daily - 3 x weekly - 3 sets - 10 reps     Access Code: [email or web address] URL: [email or web address] Date: 07/25/2022 Prepared by: Maureen Ralphs  Exercises - Seated Cervical  Sidebending Stretch  - 2 x daily - 3 sets - 30 sec hold - Seated Cervical Rotation AROM  - 2 x daily - 3 sets - 10 reps  ASSESSMENT:  CLINICAL IMPRESSION: Pt responds fair to therex today, limited mostly by mild L shoulder pain (chronic).  While pt somewhat limited, he did present with overall decreased cervical pain and reported neck feeling "looser" at end of session.  PT does recommend pt follow-up with physician regarding chronic L shoulder pain and mobility limitations. Pt will continue to benefit from skilled physical therapy intervention to address impairments, improve QOL, and attain therapy goals.     OBJECTIVE IMPAIRMENTS: Abnormal gait, decreased activity tolerance, decreased balance, decreased coordination, decreased endurance, decreased mobility, difficulty walking, decreased ROM, decreased strength, hypomobility, and pain.   ACTIVITY LIMITATIONS: carrying, lifting, bending, standing, squatting, stairs, reach over head, and hygiene/grooming  PARTICIPATION LIMITATIONS: cleaning, laundry, driving, shopping, community activity, and yard work  PERSONAL FACTORS: Age and 1-2 comorbidities: anxiety, HTN  are also affecting patient's functional outcome.   REHAB POTENTIAL: Good  CLINICAL DECISION MAKING: Evolving/moderate complexity  EVALUATION COMPLEXITY: Moderate   GOALS: Goals reviewed with patient? Yes  SHORT TERM GOALS: Target date: 09/05/2022  Pt will be independent with HEP in order to improve strength and decrease back pain in order to improve pain-free function at home and work.    Baseline: EVAL- Patient with no formal HEP in place; 6/24: Pt reports he does his exercises maybe every other day, he is not entirely sure Goal status: IN PROGRESS    LONG TERM GOALS:  Target date: 10/17/2022  Pt will decrease worst neck pain as reported on NPRS by at least 2 points in order to demonstrate clinically significant reduction in back pain Baseline: EVAL = 4/10; 6/24:  2-3/10 Goal status: PARTIALLY MET  2.  Pt will demonstrate decrease in NDI by at least 19% in order to demonstrate clinically significant reduction in disability related to neck injury/pain  Baseline: Eval= 24%; 09/05/22: 22% Goal status: IN PROGRESS  3.  Pt will improve FOTO to target score to display perceived improvements in ability to complete ADL's.  Baseline:  6/24: 58 (previous score 57) Goal status: MET  4.  Pt will improve FGA by at least 4 points in order to demonstrate clinically significant improvement in balance.   Baseline: 15/30; 6/24: 25/30  Goal status: MET  5.  Pt will decrease 5TSTS by at least 3 seconds in order to demonstrate clinically significant improvement in LE strength. Baseline: 19.48 sec;  6/24: 17.5 sec Goal status: PARTIALLY MET  6.  Pt will increase by at least 0.13 m/s in order to demonstrate clinically significant improvement in community ambulation.   Baseline: EVAL= 0.66 m/s; 6/24: 1.0 m/s  Goal status: MET  7.  Patient will present with > 5 deg improved cervical Rotation with active motion for improved ability turn his head while walking or driving.  Baseline: EVAL- Cervical Rotation L/R= 52/75 deg; 09/03/22: Cervical Rotation L/R = 45/72  Goal status: INITIAL  PLAN:  PT FREQUENCY: 1-2x/week  PT DURATION: 12 weeks  PLANNED INTERVENTIONS: Therapeutic exercises, Therapeutic activity, Neuromuscular re-education, Balance training, Gait training, Patient/Family education, Self Care, Joint mobilization, Joint manipulation, Stair training, Vestibular training, Canalith repositioning, DME instructions, Dry Needling, Electrical stimulation, Spinal manipulation, Spinal mobilization, Cryotherapy, Moist heat, Manual therapy, and Re-evaluation  PLAN FOR NEXT SESSION: Continue with manual therapy for pain relief and ROM of c-spine and balance training for impaired balance, UE strengthening within pain-free ranges. Continue plan  Baird Kay PT  ,DPT Physical Therapist- [place]  [facility]   10/03/2022, 8:18 AM

## 2022-10-04 ENCOUNTER — Ambulatory Visit: Payer: Medicare Other | Admitting: Physical Therapy

## 2022-10-04 ENCOUNTER — Ambulatory Visit: Admission: RE | Admit: 2022-10-04 | Payer: Medicare Other | Source: Home / Self Care | Admitting: Gastroenterology

## 2022-10-04 ENCOUNTER — Other Ambulatory Visit: Payer: Self-pay | Admitting: Student

## 2022-10-04 ENCOUNTER — Encounter: Admission: RE | Payer: Self-pay | Source: Home / Self Care

## 2022-10-04 DIAGNOSIS — I6522 Occlusion and stenosis of left carotid artery: Secondary | ICD-10-CM

## 2022-10-04 SURGERY — COLONOSCOPY
Anesthesia: General

## 2022-10-04 SURGERY — COLONOSCOPY WITH PROPOFOL
Anesthesia: General

## 2022-10-08 ENCOUNTER — Ambulatory Visit: Payer: Medicare Other

## 2022-10-08 DIAGNOSIS — M542 Cervicalgia: Secondary | ICD-10-CM | POA: Diagnosis not present

## 2022-10-08 DIAGNOSIS — R2681 Unsteadiness on feet: Secondary | ICD-10-CM

## 2022-10-08 NOTE — Therapy (Signed)
OUTPATIENT PHYSICAL THERAPY CERVICAL and BALANCE TREATMENT   Patient Name: Billy Berg MRN: [id number] DOB:Apr 30, 1940, 82 y.o., male Today's Date: 10/08/2022  END OF SESSION:  PT End of Session - 10/08/22 1539     Visit Number 19    Number of Visits 24    Date for PT Re-Evaluation 10/17/22    Progress Note Due on Visit 10    PT Start Time 1540    PT Stop Time 1615    PT Time Calculation (min) 35 min    Equipment Utilized During Treatment Gait belt    Activity Tolerance Patient tolerated treatment well    Behavior During Therapy WFL for tasks assessed/performed                   Past Medical History:  Diagnosis Date   Anxiety    Arthritis    GERD (gastroesophageal reflux disease)    History of kidney stones    Hyperlipidemia    Hypertension    Sleep apnea    Wears hearing aid in both ears    Past Surgical History:  Procedure Laterality Date   CATARACT EXTRACTION W/PHACO Right 07/18/2021   Procedure: CATARACT EXTRACTION PHACO AND INTRAOCULAR LENS PLACEMENT (IOC) RIGHT;  Surgeon: Galen Manila, MD;  Location: [facility];  Service: Ophthalmology;  Laterality: Right;  sleep apnea 8.51 00:48.6   CATARACT EXTRACTION W/PHACO Left 08/01/2021   Procedure: CATARACT EXTRACTION PHACO AND INTRAOCULAR LENS PLACEMENT (IOC) LEFT 8.06 01:02.5;  Surgeon: Galen Manila, MD;  Location: [facility];  Service: Ophthalmology;  Laterality: Left;  sleep apnea   COLONOSCOPY     COLONOSCOPY WITH PROPOFOL N/A 10/14/2017   Procedure: COLONOSCOPY WITH PROPOFOL;  Surgeon: Scot Jun, MD;  Location: [facility];  Service: Endoscopy;  Laterality: N/A;   JOINT REPLACEMENT Right 04/2007   KNEE   TONSILLECTOMY     There are no problems to display for this patient.   PCP: Dr Otha Sites  REFERRING PROVIDER: Dr. Emmitt Sites  REFERRING DIAG: Neck pain; Balance disorder  THERAPY DIAG:  Cervicalgia  Unsteadiness on feet  Rationale for Evaluation and  Treatment: Rehabilitation  ONSET DATE: Around 6 months ago (02/2022)  SUBJECTIVE:                                                                                                                                                                                                         SUBJECTIVE STATEMENT: Pt reports no neck pain just some discomfort currently. Pt reports no stumbles or falls. Pt to have  more diagnostics of cervical region in the future.   Hand dominance: Right  PERTINENT HISTORY:   Patient presents with referral to outpatient PT following appt with Dr. Graciela Husbands for annual exam- including some neck and back pain and imbalance.   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  COLONOSCOPY 12/03/2013  Four 1-5 mm adenomas, sigmoid diverticulosis, internal hemorrhoids; repeat in 11/2016.  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 Yes: NPRS scale: 3-4/10 Pain location: Left upper trap region Pain description: ache Aggravating factors: lifting/driving, turning my head Relieving factors: rest, meds, occasional use of heat  PRECAUTIONS: Fall  WEIGHT BEARING RESTRICTIONS: No  FALLS:  Has patient fallen in last 6 months? Yes. Number of falls 2  LIVING ENVIRONMENT: Lives with: lives with their spouse Lives in: House/apartment Stairs: Yes: Internal: 12 steps; can reach both Has following equipment at home: None  OCCUPATION: Retired Software engineer  PLOF: Independent  PATIENT GOALS: To have improved flexibility in my neck and improve my balance  NEXT MD VISIT: none  OBJECTIVE:    DIAGNOSTIC FINDINGS:  None recent  PATIENT SURVEYS:  NDI 24% indicating mild disability FOTO 43  COGNITION: Overall cognitive status: Within functional limits for tasks assessed- does revert to wife at times to answer questions  SENSATION: WFL with all 4 extremities  POSTURE: rounded shoulders and forward head  PALPATION: (+) tenderness along left Upper trap region with taught bands identified   CERVICAL ROM:   Active ROM A/PROM (deg) eval  Flexion 45  Extension 68  Right lateral flexion 18*  Left lateral flexion 26*  Right rotation 75*  Left rotation 52*   (Blank rows = not tested)    UPPER EXTREMITY MMT:  MMT Right eval Left eval  Shoulder flexion 5 4  Shoulder extension NT 4  Shoulder abduction 5 4  Shoulder adduction 5 4  Shoulder internal rotation 5 4  Shoulder external rotation 5 4  Elbow flexion 5 4+  Elbow extension 5 4+   (Blank rows = not tested)  Strength R/L 4/4 Hip flexion 4/4 Hip external rotation 4/4 Hip internal rotation 4/4 Hip extension  4/4 Hip abduction 5/5 Hip adduction 4/4 Knee extension 5/5 Knee flexion NT- Ankle Plantarflexion 5/5 Ankle Dorsiflexion     CERVICAL SPECIAL TESTS:  Spurling's test: Negative   Gait - Wide base of support- decreased step length- waddle gait pattern approx 120 feet total.   FUNCTIONAL TESTS:  5 times sit to stand: 19.48 10 meter walk test: 15.10sec or 0.66 m/s Functional gait assessment: 15/30  TODAY'S TREATMENT:  DATE: 10/08/22  Gelt belt donned and CGA provided unless otherwise specified   NMR:   Standing at firm surface:  NBOS EC 2x30 sec  Tandem stance 2x30 sec each LE   SLB progression cone taps (3 cones) x multiple reps each LE  SLB with at least 3 finger support 2x30 sec each LE  Rocker board FWD/BCKWD with intermittent UE support   Gait  with horizontal head turns followed by retro-gait 4x length of // bars - mildly unsteady  Cross-over LTL stepping x multiple reps each way down // bars. PT provides CGA-min a throughout to correct for LOB.  High knee marching SLB progression 8x length of bars   Tandem gait 8x length of // bars intermittent step strategy to correct for LOB, but pt requires no more than CGA  Standing on slope EO 2x30 sec, EC 2x30 sec - able to complete without UE support  Standing sustained heel raise 2x20 sec, cuing for forward gaze 1x20 sec  PATIENT EDUCATION:  Education details: Pt educated throughout session about proper posture and technique with exercises. Improved exercise technique, movement at target joints, use of target muscles after min to mod verbal, visual, tactile cues.   Person educated: Patient Education method: Explanation, Demonstration, Tactile cues, and Verbal cues Education comprehension: verbalized understanding, returned demonstration, verbal cues required, tactile cues required, and needs further education  HOME EXERCISE PROGRAM: Access Code: [email or web address] URL: [email or web address] Date: 07/31/2022 Prepared by: Maureen Ralphs  Exercises - Seated Shoulder Rolls  - 1 x daily - 7 x weekly - 3 sets - 10 reps - Standing Shoulder Row with Anchored Resistance  - 3 x weekly - 3 sets - 10 reps - Supine Chin Tuck  - 1 x daily - 7 x weekly - 3 sets - 10 reps - Shoulder extension with resistance - Neutral  - 1 x daily - 3 x weekly - 3 sets - 10 reps     Access Code: [email or web address] URL: [email or web address] Date: 07/25/2022 Prepared by: Maureen Ralphs  Exercises - Seated Cervical Sidebending Stretch  - 2 x daily - 3 sets - 30 sec hold - Seated Cervical Rotation AROM  - 2 x daily - 3 sets - 10 reps  ASSESSMENT:  CLINICAL IMPRESSION: Visit focused on balance interventions. Pt exhibits good balance with tandem gait and stance requiring no more than CGA.  While pt doing well with this NBOS activity, he still struggles with SLB tasks. Plan to continue to address this deficit. The pt will continue to benefit from skilled physical therapy intervention to address impairments, improve QOL, and attain therapy goals.     OBJECTIVE IMPAIRMENTS: Abnormal gait, decreased activity tolerance, decreased balance, decreased coordination, decreased endurance, decreased mobility, difficulty walking, decreased ROM, decreased strength, hypomobility, and pain.   ACTIVITY LIMITATIONS: carrying, lifting, bending, standing, squatting, stairs, reach over head, and hygiene/grooming  PARTICIPATION LIMITATIONS: cleaning, laundry, driving, shopping, community activity, and yard work  PERSONAL FACTORS: Age and 1-2 comorbidities: anxiety, HTN  are also affecting patient's functional outcome.   REHAB POTENTIAL: Good  CLINICAL DECISION MAKING: Evolving/moderate complexity  EVALUATION COMPLEXITY: Moderate   GOALS: Goals reviewed with patient? Yes  SHORT TERM GOALS: Target date: 09/05/2022  Pt will be independent with HEP in order to improve strength and decrease back pain in order to improve pain-free function at home and work.    Baseline: EVAL- Patient with no formal HEP in place; 6/24: Pt reports he does his exercises maybe every other day, he is not entirely  sure Goal status: IN PROGRESS    LONG TERM GOALS: Target date: 10/17/2022  Pt will decrease worst neck pain as reported on NPRS by at least 2 points in order to demonstrate clinically significant reduction in back pain Baseline: EVAL = 4/10; 6/24: 2-3/10 Goal status: PARTIALLY MET  2.  Pt will demonstrate decrease in NDI by at least 19% in order to demonstrate clinically significant reduction in disability related to neck injury/pain  Baseline: Eval= 24%; 09/05/22: 22% Goal status: IN PROGRESS  3.  Pt will improve FOTO to target score to display perceived improvements in ability to complete ADL's.   Baseline:  6/24: 58 (previous score 57) Goal status: MET  4.  Pt will improve FGA by at least 4 points in order to demonstrate clinically significant improvement in balance.   Baseline: 15/30; 6/24: 25/30  Goal status: MET  5.  Pt will decrease 5TSTS by at least 3 seconds in order to demonstrate clinically significant improvement in LE strength. Baseline: 19.48 sec;  6/24: 17.5 sec Goal status: PARTIALLY MET  6.  Pt will increase by at least 0.13 m/s in order to demonstrate clinically significant improvement in community ambulation.   Baseline: EVAL= 0.66 m/s; 6/24: 1.0 m/s  Goal status: MET  7.  Patient will present with > 5 deg improved cervical Rotation with active motion for improved ability turn his head while walking or driving.  Baseline: EVAL- Cervical Rotation L/R= 52/75 deg; 09/03/22: Cervical Rotation L/R = 45/72  Goal status: INITIAL  PLAN:  PT FREQUENCY: 1-2x/week  PT DURATION: 12 weeks  PLANNED INTERVENTIONS: Therapeutic exercises, Therapeutic activity, Neuromuscular re-education, Balance training, Gait training, Patient/Family education, Self Care, Joint mobilization, Joint manipulation, Stair training, Vestibular training, Canalith repositioning, DME instructions, Dry Needling, Electrical stimulation, Spinal manipulation, Spinal mobilization, Cryotherapy, Moist heat, Manual therapy, and Re-evaluation  PLAN FOR NEXT SESSION: Continue with manual therapy for pain relief and ROM of c-spine and balance training for impaired balance, UE strengthening within pain-free ranges. Continue plan  Baird Kay PT ,DPT Physical Therapist- [facility]   10/08/2022, 5:25 PM

## 2022-10-09 ENCOUNTER — Ambulatory Visit
Admission: RE | Admit: 2022-10-09 | Discharge: 2022-10-09 | Disposition: A | Discharge status: Home / Self Care | Payer: Medicare Other | Source: Ambulatory Visit | Attending: Student | Admitting: Student

## 2022-10-09 DIAGNOSIS — R519 Headache, unspecified: Secondary | ICD-10-CM | POA: Diagnosis present

## 2022-10-09 DIAGNOSIS — I6522 Occlusion and stenosis of left carotid artery: Secondary | ICD-10-CM | POA: Diagnosis present

## 2022-10-09 MED ORDER — IOHEXOL 350 MG/ML SOLN
75.0000 mL | Freq: Once | INTRAVENOUS | Status: AC | PRN
  Administered 2022-10-09: 75 mL via INTRAVENOUS

## 2022-10-11 ENCOUNTER — Ambulatory Visit: Payer: Medicare Other | Attending: Internal Medicine

## 2022-10-11 DIAGNOSIS — M542 Cervicalgia: Secondary | ICD-10-CM | POA: Diagnosis not present

## 2022-10-11 DIAGNOSIS — M6281 Muscle weakness (generalized): Secondary | ICD-10-CM | POA: Insufficient documentation

## 2022-10-11 DIAGNOSIS — M5382 Other specified dorsopathies, cervical region: Secondary | ICD-10-CM | POA: Diagnosis present

## 2022-10-11 NOTE — Therapy (Signed)
OUTPATIENT PHYSICAL THERAPY CERVICAL and BALANCE TREATMENT/Physical Therapy Progress Note   Dates of reporting period  09/03/2022   to   10/11/2022    Patient Name: Billy Berg MRN: [id number] DOB:09/23/1940, 82 y.o., male Today's Date: 10/11/2022  END OF SESSION:  PT End of Session - 10/11/22 1619     Visit Number 20    Number of Visits 24    Date for PT Re-Evaluation 10/17/22    Progress Note Due on Visit 10    PT Start Time 1623    PT Stop Time 1659    PT Time Calculation (min) 36 min    Equipment Utilized During Treatment Gait belt    Activity Tolerance Patient tolerated treatment well    Behavior During Therapy WFL for tasks assessed/performed                   Past Medical History:  Diagnosis Date   Anxiety    Arthritis    GERD (gastroesophageal reflux disease)    History of kidney stones    Hyperlipidemia    Hypertension    Sleep apnea    Wears hearing aid in both ears    Past Surgical History:  Procedure Laterality Date   CATARACT EXTRACTION W/PHACO Right 07/18/2021   Procedure: CATARACT EXTRACTION PHACO AND INTRAOCULAR LENS PLACEMENT (IOC) RIGHT;  Surgeon: Galen Manila, MD;  Location: [facility];  Service: Ophthalmology;  Laterality: Right;  sleep apnea 8.51 00:48.6   CATARACT EXTRACTION W/PHACO Left 08/01/2021   Procedure: CATARACT EXTRACTION PHACO AND INTRAOCULAR LENS PLACEMENT (IOC) LEFT 8.06 01:02.5;  Surgeon: Galen Manila, MD;  Location: [facility];  Service: Ophthalmology;  Laterality: Left;  sleep apnea   COLONOSCOPY     COLONOSCOPY WITH PROPOFOL N/A 10/14/2017   Procedure: COLONOSCOPY WITH PROPOFOL;  Surgeon: Scot Jun, MD;  Location: [facility];  Service: Endoscopy;  Laterality: N/A;   JOINT REPLACEMENT Right 04/2007   KNEE   TONSILLECTOMY     There are no problems to display for this patient.   PCP: Dr Yobany Sites  REFERRING PROVIDER: Dr. Darian Sites  REFERRING DIAG: Neck pain; Balance  disorder  THERAPY DIAG:  Cervicalgia  Limited active range of motion (AROM) of cervical spine on lateral flexion to left  Limited active range of motion of cervical spine on rotation to right  Muscle weakness (generalized)  Rationale for Evaluation and Treatment: Rehabilitation  ONSET DATE: Around 6 months ago (02/2022)  SUBJECTIVE:  SUBJECTIVE STATEMENT: Pt says his neck has been "pretty good." He reports no neck pain at the moment. Pt with spouse and reports no falls. Pt reports no stumbles, no other updates.  Hand dominance: Right  PERTINENT HISTORY:   Patient presents with referral to outpatient PT following appt with Dr. Graciela Husbands for annual exam- including some neck and back pain and imbalance.   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  COLONOSCOPY 12/03/2013  Four 1-5 mm adenomas, sigmoid diverticulosis, internal hemorrhoids; repeat in 11/2016.  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 Yes: NPRS scale: 3-4/10 Pain location: Left upper trap region Pain description: ache Aggravating factors: lifting/driving, turning my head Relieving factors: rest, meds, occasional use of heat  PRECAUTIONS: Fall  WEIGHT BEARING RESTRICTIONS: No  FALLS:  Has patient fallen in last 6 months? Yes. Number of falls 2  LIVING ENVIRONMENT: Lives with: lives with their spouse Lives in: House/apartment Stairs: Yes:  Internal: 12 steps; can reach both Has following equipment at home: None  OCCUPATION: Retired Software engineer  PLOF: Independent  PATIENT GOALS: To have improved flexibility in my neck and improve my balance  NEXT MD VISIT: none  OBJECTIVE:   DIAGNOSTIC FINDINGS:  None recent  PATIENT SURVEYS:  NDI 24% indicating mild disability FOTO 64  COGNITION: Overall cognitive status: Within functional limits for tasks assessed- does revert to wife at times to answer questions  SENSATION: WFL with all 4 extremities  POSTURE: rounded shoulders and forward head  PALPATION: (+) tenderness along left Upper trap region with taught bands identified   CERVICAL ROM:   Active ROM A/PROM (deg) eval  Flexion 45  Extension 68  Right lateral flexion 18*  Left lateral flexion 26*  Right rotation 75*  Left rotation 52*   (Blank rows = not tested)    UPPER EXTREMITY MMT:  MMT Right eval Left eval  Shoulder flexion 5 4  Shoulder extension NT 4  Shoulder abduction 5 4  Shoulder adduction 5 4  Shoulder internal rotation 5 4  Shoulder external rotation 5 4  Elbow flexion 5 4+  Elbow extension 5 4+   (Blank rows = not tested)  Strength R/L 4/4 Hip flexion 4/4 Hip external rotation 4/4 Hip internal rotation 4/4 Hip extension  4/4 Hip abduction 5/5 Hip adduction 4/4 Knee extension 5/5 Knee flexion NT- Ankle Plantarflexion 5/5 Ankle Dorsiflexion     CERVICAL SPECIAL TESTS:  Spurling's test: Negative   Gait - Wide base of support- decreased step length- waddle gait pattern approx 120 feet total.   FUNCTIONAL TESTS:  5 times sit to stand: 19.48 10 meter walk test: 15.10sec or 0.66 m/s Functional gait assessment: 15/30  TODAY'S TREATMENT:  DATE: 10/11/22  Gelt belt donned and CGA provided unless otherwise specified   TA: goals  reassessed for progress note. Please refer to goal section below for details.   TE: Cervical rotation stretch 2x30 sec each side Cervical lateral flexion stretch 2x30 sec each side  Shrugs with UT stretch with shoulder depression x multiple reps each side Matrix cable machine 2.5# 10x bilat UE, 7.5# 2x10 bliat UE   PATIENT EDUCATION:  Education details: Pt educated throughout session about proper posture and technique with exercises. Improved exercise technique, movement at target joints, use of target muscles after min to mod verbal, visual, tactile cues.   Person educated: Patient Education method: Explanation, Demonstration, Tactile cues, and Verbal cues Education comprehension: verbalized understanding, returned demonstration, verbal cues required, tactile cues required, and needs further education  HOME EXERCISE PROGRAM: Access Code: [email or web address] URL: [email or web address] Date: 07/31/2022 Prepared by: Maureen Ralphs  Exercises - Seated Shoulder Rolls  - 1 x daily - 7 x weekly - 3 sets - 10 reps - Standing Shoulder Row with Anchored Resistance  - 3 x weekly - 3 sets - 10 reps - Supine Chin Tuck  - 1 x daily - 7 x weekly - 3 sets - 10 reps - Shoulder extension with resistance - Neutral  - 1 x daily - 3 x weekly - 3 sets - 10 reps     Access Code: [email or web address] URL: [email or web address] Date: 07/25/2022 Prepared by: Maureen Ralphs  Exercises - Seated Cervical Sidebending Stretch  - 2 x daily - 3 sets - 30 sec hold - Seated Cervical Rotation AROM  - 2 x daily - 3 sets - 10 reps  ASSESSMENT:  CLINICAL IMPRESSION: Goals reassessed for progress note. Pt has met 5/7 LTG, and partially met 1 of the two remaining LTGs. This indicates improvements in neck pain, disability due to neck pain, and LE strength/decreased fall risk. Pt did show some improvement with cervical AROM compared to prior assessment, but this still has not improved beyond baseline  at eval. The pt will continue to benefit from skilled physical therapy intervention to address impairments, improve QOL, and attain therapy goals.     OBJECTIVE IMPAIRMENTS: Abnormal gait, decreased activity tolerance, decreased balance, decreased coordination, decreased endurance, decreased mobility, difficulty walking, decreased ROM, decreased strength, hypomobility, and pain.   ACTIVITY LIMITATIONS: carrying, lifting, bending, standing, squatting, stairs, reach over head, and hygiene/grooming  PARTICIPATION LIMITATIONS: cleaning, laundry, driving, shopping, community activity, and yard work  PERSONAL FACTORS: Age and 1-2 comorbidities: anxiety, HTN  are also affecting patient's functional outcome.   REHAB POTENTIAL: Good  CLINICAL DECISION MAKING: Evolving/moderate complexity  EVALUATION COMPLEXITY: Moderate   GOALS: Goals reviewed with patient? Yes  SHORT TERM GOALS: Target date: 09/05/2022  Pt will be independent with HEP in order to improve strength and decrease back pain in order to improve pain-free function at home and work.    Baseline: EVAL- Patient with no formal HEP in place; 6/24: Pt reports he does his exercises maybe every other day, he is not entirely sure; 10/11/22: "Not often," completing 2x/week. He is not performing entire HEP. Goal status: IN PROGRESS    LONG TERM GOALS: Target date: 10/17/2022  Pt will decrease worst neck pain as reported on NPRS by at least 2 points in order to demonstrate clinically significant reduction in neck pain Baseline: EVAL = 4/10; 6/24: 2-3/10; 10/11/22: Pt rates worst pain as 2/10 Goal status: MET  2.  Pt will demonstrate decrease in NDI by at least 19%  in order to demonstrate clinically significant reduction in disability related to neck injury/pain  Baseline: Eval= 24%; 09/05/22: 22%; 10/11/22: 7 Goal status: PARTIALLY MET  3.  Pt will improve FOTO to target score to display perceived improvements in ability to complete ADL's.   Baseline:  6/24: 58 (previous score 57); 10/11/2022: 59 Goal status: MET  4.  Pt will improve FGA by at least 4 points in order to demonstrate clinically significant improvement in balance.   Baseline: 15/30; 6/24: 25/30  Goal status: MET  5.  Pt will decrease 5TSTS by at least 3 seconds in order to demonstrate clinically significant improvement in LE strength. Baseline: 19.48 sec;  6/24: 17.5 sec; 10/11/22: 16 sec hands-free  Goal status: MET  6.  Pt will increase by at least 0.13 m/s in order to demonstrate clinically significant improvement in community ambulation.   Baseline: EVAL= 0.66 m/s; 6/24: 1.0 m/s  Goal status: MET  7.  Patient will present with > 5 deg improved cervical Rotation with active motion for improved ability turn his head while walking or driving.  Baseline: EVAL- Cervical Rotation L/R= 52/75 deg; 09/03/22: Cervical Rotation L/R = 45/72; 8/1/2: L 50 deg, R 73 deg  Goal status: ONGOING  PLAN:  PT FREQUENCY: 1-2x/week  PT DURATION: 12 weeks  PLANNED INTERVENTIONS: Therapeutic exercises, Therapeutic activity, Neuromuscular re-education, Balance training, Gait training, Patient/Family education, Self Care, Joint mobilization, Joint manipulation, Stair training, Vestibular training, Canalith repositioning, DME instructions, Dry Needling, Electrical stimulation, Spinal manipulation, Spinal mobilization, Cryotherapy, Moist heat, Manual therapy, and Re-evaluation  PLAN FOR NEXT SESSION: Continue with manual therapy for pain relief and ROM of c-spine and balance training for impaired balance, UE strengthening within pain-free ranges. Continue plan  Baird Kay PT ,DPT Physical Therapist- [facility]   10/11/2022, 5:11 PM

## 2022-10-15 ENCOUNTER — Ambulatory Visit: Payer: Medicare Other

## 2022-10-15 DIAGNOSIS — M542 Cervicalgia: Secondary | ICD-10-CM

## 2022-10-15 DIAGNOSIS — M6281 Muscle weakness (generalized): Secondary | ICD-10-CM

## 2022-10-15 DIAGNOSIS — M5382 Other specified dorsopathies, cervical region: Secondary | ICD-10-CM

## 2022-10-15 NOTE — Therapy (Signed)
OUTPATIENT PHYSICAL THERAPY CERVICAL and BALANCE TREATMENT    Patient Name: Billy Berg MRN: [id number] DOB:10/28/1940, 82 y.o., male Today's Date: 10/15/2022  END OF SESSION:  PT End of Session - 10/15/22 1702     Visit Number 21    Number of Visits 24    Date for PT Re-Evaluation 10/17/22    Progress Note Due on Visit 10    PT Start Time 1618    PT Stop Time 1700    PT Time Calculation (min) 42 min    Equipment Utilized During Treatment Gait belt    Activity Tolerance Patient tolerated treatment well    Behavior During Therapy WFL for tasks assessed/performed                    Past Medical History:  Diagnosis Date   Anxiety    Arthritis    GERD (gastroesophageal reflux disease)    History of kidney stones    Hyperlipidemia    Hypertension    Sleep apnea    Wears hearing aid in both ears    Past Surgical History:  Procedure Laterality Date   CATARACT EXTRACTION W/PHACO Right 07/18/2021   Procedure: CATARACT EXTRACTION PHACO AND INTRAOCULAR LENS PLACEMENT (IOC) RIGHT;  Surgeon: Galen Manila, MD;  Location: [facility];  Service: Ophthalmology;  Laterality: Right;  sleep apnea 8.51 00:48.6   CATARACT EXTRACTION W/PHACO Left 08/01/2021   Procedure: CATARACT EXTRACTION PHACO AND INTRAOCULAR LENS PLACEMENT (IOC) LEFT 8.06 01:02.5;  Surgeon: Galen Manila, MD;  Location: [facility];  Service: Ophthalmology;  Laterality: Left;  sleep apnea   COLONOSCOPY     COLONOSCOPY WITH PROPOFOL N/A 10/14/2017   Procedure: COLONOSCOPY WITH PROPOFOL;  Surgeon: Scot Jun, MD;  Location: [facility];  Service: Endoscopy;  Laterality: N/A;   JOINT REPLACEMENT Right 04/2007   KNEE   TONSILLECTOMY     There are no problems to display for this patient.   PCP: Dr Escher Sites  REFERRING PROVIDER: Dr. Leemon Sites  REFERRING DIAG: Neck pain; Balance disorder  THERAPY DIAG:  Cervicalgia  Muscle weakness (generalized)  Limited active  range of motion (AROM) of cervical spine on lateral flexion to left  Limited active range of motion of cervical spine on rotation to right  Rationale for Evaluation and Treatment: Rehabilitation  ONSET DATE: Around 6 months ago (02/2022)  SUBJECTIVE:  SUBJECTIVE STATEMENT: Pt reports neck pain is 1.5-2/10. Pt reports no falls. Pt has follow-up appointment with his Dr this Thursday.  Hand dominance: Right  PERTINENT HISTORY:   Patient presents with referral to outpatient PT following appt with Dr. Graciela Husbands for annual exam- including some neck and back pain and imbalance.   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  COLONOSCOPY 12/03/2013  Four 1-5 mm adenomas, sigmoid diverticulosis, internal hemorrhoids; repeat in 11/2016.  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 Yes: NPRS scale: 3-4/10 Pain location: Left upper trap region Pain description: ache Aggravating factors: lifting/driving, turning my head Relieving factors: rest, meds, occasional use of heat  PRECAUTIONS: Fall  WEIGHT BEARING RESTRICTIONS: No  FALLS:  Has patient fallen in last 6 months? Yes. Number of falls 2  LIVING ENVIRONMENT: Lives with: lives with their spouse Lives in: House/apartment Stairs: Yes: Internal: 12 steps; can reach both Has following equipment at home: None  OCCUPATION: Retired Software engineer  PLOF:  Independent  PATIENT GOALS: To have improved flexibility in my neck and improve my balance  NEXT MD VISIT: none  OBJECTIVE:   DIAGNOSTIC FINDINGS:  None recent  PATIENT SURVEYS:  NDI 24% indicating mild disability FOTO 23  COGNITION: Overall cognitive status: Within functional limits for tasks assessed- does revert to wife at times to answer questions  SENSATION: WFL with all 4 extremities  POSTURE: rounded shoulders and forward head  PALPATION: (+) tenderness along left Upper trap region with taught bands identified   CERVICAL ROM:   Active ROM A/PROM (deg) eval  Flexion 45  Extension 68  Right lateral flexion 18*  Left lateral flexion 26*  Right rotation 75*  Left rotation 52*   (Blank rows = not tested)    UPPER EXTREMITY MMT:  MMT Right eval Left eval  Shoulder flexion 5 4  Shoulder extension NT 4  Shoulder abduction 5 4  Shoulder adduction 5 4  Shoulder internal rotation 5 4  Shoulder external rotation 5 4  Elbow flexion 5 4+  Elbow extension 5 4+   (Blank rows = not tested)  Strength R/L 4/4 Hip flexion 4/4 Hip external rotation 4/4 Hip internal rotation 4/4 Hip extension  4/4 Hip abduction 5/5 Hip adduction 4/4 Knee extension 5/5 Knee flexion NT- Ankle Plantarflexion 5/5 Ankle Dorsiflexion     CERVICAL SPECIAL TESTS:  Spurling's test: Negative   Gait - Wide base of support- decreased step length- waddle gait pattern approx 120 feet total.   FUNCTIONAL TESTS:  5 times sit to stand: 19.48 10 meter walk test: 15.10sec or 0.66 m/s Functional gait assessment: 15/30  TODAY'S TREATMENT:  DATE: 10/15/22  Gelt belt donned and CGA provided unless otherwise specified   Manual:  STM and TrP release bilat UT and cervical paraspinals. TrPs bilaterally throughout. Pt cued through UT stretch each side while  providing manual. Following manual pt turns head and feels spasm in R cervical paraspinals this improves with repeated motion   TE: Cervical rotation stretch 2x30 sec each side Cervical lateral flexion stretch 2x30 sec each side  Rhomboids stretch x 30 sec  Matrix cable machine 7.5# 2x10, 12.5# 2x10 bliat UE Matrix cable machine shrugs 7.5# 2x10. Compensates with biceps, difficulty correcting with cues Wall push-up 2x10 bilat UE  Shoulder circles CW/CC 10x for each  Shrug with UT stretch with shoulder depression 5x each side    PATIENT EDUCATION:  Education details: Pt educated throughout session about proper posture and technique with exercises. Improved exercise technique, movement at target joints, use of target muscles after min to mod verbal, visual, tactile cues.   Person educated: Patient Education method: Explanation, Demonstration, Tactile cues, and Verbal cues Education comprehension: verbalized understanding, returned demonstration, verbal cues required, tactile cues required, and needs further education  HOME EXERCISE PROGRAM: Access Code: [email or web address] URL: [email or web address] Date: 07/31/2022 Prepared by: Maureen Ralphs  Exercises - Seated Shoulder Rolls  - 1 x daily - 7 x weekly - 3 sets - 10 reps - Standing Shoulder Row with Anchored Resistance  - 3 x weekly - 3 sets - 10 reps - Supine Chin Tuck  - 1 x daily - 7 x weekly - 3 sets - 10 reps - Shoulder extension with resistance - Neutral  - 1 x daily - 3 x weekly - 3 sets - 10 reps     Access Code: [email or web address] URL: [email or web address] Date: 07/25/2022 Prepared by: Maureen Ralphs  Exercises - Seated Cervical Sidebending Stretch  - 2 x daily - 3 sets - 30 sec hold - Seated Cervical Rotation AROM  - 2 x daily - 3 sets - 10 reps  ASSESSMENT:  CLINICAL IMPRESSION: Pt continues to present with low levels of cervical pain upon arrival. He did experience what appeared to be a mm  spasm following manual therapy, but this improved with repeated motion. While pt has consistently been presenting with lower pain levels, he still is very restricted with cervical lateral flexion and rotation. The pt will continue to benefit from skilled physical therapy intervention to address impairments, improve QOL, and attain therapy goals.     OBJECTIVE IMPAIRMENTS: Abnormal gait, decreased activity tolerance, decreased balance, decreased coordination, decreased endurance, decreased mobility, difficulty walking, decreased ROM, decreased strength, hypomobility, and pain.   ACTIVITY LIMITATIONS: carrying, lifting, bending, standing, squatting, stairs, reach over head, and hygiene/grooming  PARTICIPATION LIMITATIONS: cleaning, laundry, driving, shopping, community activity, and yard work  PERSONAL FACTORS: Age and 1-2 comorbidities: anxiety, HTN  are also affecting patient's functional outcome.   REHAB POTENTIAL: Good  CLINICAL DECISION MAKING: Evolving/moderate complexity  EVALUATION COMPLEXITY: Moderate   GOALS: Goals reviewed with patient? Yes  SHORT TERM GOALS: Target date: 09/05/2022  Pt will be independent with HEP in order to improve strength and decrease back pain in order to improve pain-free function at home and work.    Baseline: EVAL- Patient with no formal HEP in place; 6/24: Pt reports he does his exercises maybe every other day, he is not entirely sure; 10/11/22: "Not often," completing 2x/week. He is not performing entire HEP. Goal status: IN PROGRESS    LONG TERM GOALS: Target date: 10/17/2022  Pt will decrease  worst neck pain as reported on NPRS by at least 2 points in order to demonstrate clinically significant reduction in neck pain Baseline: EVAL = 4/10; 6/24: 2-3/10; 10/11/22: Pt rates worst pain as 2/10 Goal status: MET  2.  Pt will demonstrate decrease in NDI by at least 19% in order to demonstrate clinically significant reduction in disability related to neck  injury/pain  Baseline: Eval= 24%; 09/05/22: 22%; 10/11/22: 7 Goal status: PARTIALLY MET  3.  Pt will improve FOTO to target score to display perceived improvements in ability to complete ADL's.  Baseline:  6/24: 58 (previous score 57); 10/11/2022: 59 Goal status: MET  4.  Pt will improve FGA by at least 4 points in order to demonstrate clinically significant improvement in balance.   Baseline: 15/30; 6/24: 25/30  Goal status: MET  5.  Pt will decrease 5TSTS by at least 3 seconds in order to demonstrate clinically significant improvement in LE strength. Baseline: 19.48 sec;  6/24: 17.5 sec; 10/11/22: 16 sec hands-free  Goal status: MET  6.  Pt will increase by at least 0.13 m/s in order to demonstrate clinically significant improvement in community ambulation.   Baseline: EVAL= 0.66 m/s; 6/24: 1.0 m/s  Goal status: MET  7.  Patient will present with > 5 deg improved cervical Rotation with active motion for improved ability turn his head while walking or driving.  Baseline: EVAL- Cervical Rotation L/R= 52/75 deg; 09/03/22: Cervical Rotation L/R = 45/72; 8/1/2: L 50 deg, R 73 deg  Goal status: ONGOING  PLAN:  PT FREQUENCY: 1-2x/week  PT DURATION: 12 weeks  PLANNED INTERVENTIONS: Therapeutic exercises, Therapeutic activity, Neuromuscular re-education, Balance training, Gait training, Patient/Family education, Self Care, Joint mobilization, Joint manipulation, Stair training, Vestibular training, Canalith repositioning, DME instructions, Dry Needling, Electrical stimulation, Spinal manipulation, Spinal mobilization, Cryotherapy, Moist heat, Manual therapy, and Re-evaluation  PLAN FOR NEXT SESSION: Continue with manual therapy for pain relief and ROM of c-spine and balance training for impaired balance, UE strengthening within pain-free ranges. Continue plan  Baird Kay PT ,DPT Physical Therapist- [facility]   10/15/2022, 5:05 PM

## 2022-10-18 ENCOUNTER — Ambulatory Visit: Payer: Medicare Other

## 2022-10-18 ENCOUNTER — Ambulatory Visit (INDEPENDENT_AMBULATORY_CARE_PROVIDER_SITE_OTHER): Payer: Medicare Other | Admitting: Vascular Surgery

## 2022-10-18 ENCOUNTER — Encounter (INDEPENDENT_AMBULATORY_CARE_PROVIDER_SITE_OTHER): Payer: Self-pay | Admitting: Vascular Surgery

## 2022-10-18 VITALS — BP 126/70 | HR 78 | Resp 18 | Ht 68.0 in | Wt 232.2 lb

## 2022-10-18 DIAGNOSIS — I10 Essential (primary) hypertension: Secondary | ICD-10-CM | POA: Diagnosis not present

## 2022-10-18 DIAGNOSIS — K219 Gastro-esophageal reflux disease without esophagitis: Secondary | ICD-10-CM

## 2022-10-18 DIAGNOSIS — M542 Cervicalgia: Secondary | ICD-10-CM | POA: Diagnosis not present

## 2022-10-18 DIAGNOSIS — M6281 Muscle weakness (generalized): Secondary | ICD-10-CM

## 2022-10-18 DIAGNOSIS — E782 Mixed hyperlipidemia: Secondary | ICD-10-CM

## 2022-10-18 DIAGNOSIS — I6523 Occlusion and stenosis of bilateral carotid arteries: Secondary | ICD-10-CM

## 2022-10-18 NOTE — Progress Notes (Signed)
MRN : [id number]  Billy Berg is a 82 y.o. (1940-06-15) male who presents with chief complaint of check carotid arteries.  History of Present Illness: The patient is seen for evaluation of carotid stenosis. The carotid stenosis was identified after a bruit was auscultated.  Subsequently, a CT angio was obtained dated October 09, 2022.  The patient denies amaurosis fugax. There is no recent history of TIA symptoms or focal motor deficits. There is no prior documented CVA.  There is no history of migraine headaches. There is no history of seizures.  The patient is taking enteric-coated aspirin 81 mg daily.  No recent shortening of the patient's walking distance or new symptoms consistent with claudication.  No history of rest pain symptoms. No new ulcers or wounds of the lower extremities have occurred.  There is no history of DVT, PE or superficial thrombophlebitis. No recent episodes of angina or shortness of breath documented.  CT angiogram dated 10/09/2022 was reviewed by me with the patient and family.  It shows occlusion of the left internal carotid artery.  There is 60% stenosis of the right internal carotid artery.  Current Meds  Medication Sig   amLODipine (NORVASC) 10 MG tablet Take 10 mg by mouth daily.   amoxicillin (AMOXIL) 500 MG tablet Take 2,000 mg by mouth. 1 hour prior to dental procedures   aspirin EC 81 MG tablet Take 81 mg by mouth daily.   atorvastatin (LIPITOR) 20 MG tablet Take 20 mg by mouth daily.   azelastine (ASTELIN) 0.1 % nasal spray Place 1 spray into both nostrils 2 (two) times daily. Use in each nostril as directed   Bacillus Coagulans-Inulin (ALIGN PREBIOTIC-PROBIOTIC PO) Take by mouth daily.   carboxymethylcellulose (REFRESH PLUS) 0.5 % SOLN 1 drop as needed.   donepezil (ARICEPT) 5 MG tablet Take 5 mg by mouth at bedtime.   fexofenadine (ALLEGRA) 180 MG tablet Take 180 mg by mouth daily.   losartan (COZAAR) 100 MG tablet Take by  mouth.   losartan-hydrochlorothiazide (HYZAAR) 100-12.5 MG tablet Take 1 tablet by mouth daily.   Methylcellulose, Laxative, (CITRUCEL PO) Take by mouth daily.   mometasone (NASONEX) 50 MCG/ACT nasal spray Place into the nose.   Multiple Vitamin (MULTIVITAMIN) tablet Take 1 tablet by mouth daily. Optimal-M, Nutrifii multivit,  1 at breakfast, 1 at dinner Optimal-V Nutrifii multivit, 2 at breakfast, 1 at dinner   omega-3 acid ethyl esters (LOVAZA) 1 g capsule Take 1 g by mouth 2 (two) times daily.   omeprazole (PRILOSEC) 20 MG capsule Take 20 mg by mouth daily.   Probiotic Product (ALIGN) 4 MG CAPS Take by mouth.   sertraline (ZOLOFT) 50 MG tablet Take 25 mg by mouth daily.   Turmeric 500 MG TABS Take 2,000 mg by mouth daily.    Past Medical History:  Diagnosis Date   Anxiety    Arthritis    GERD (gastroesophageal reflux disease)    History of kidney stones    Hyperlipidemia    Hypertension    Sleep apnea    Wears hearing aid in both ears     Past Surgical History:  Procedure Laterality Date   CATARACT EXTRACTION W/PHACO Right 07/18/2021   Procedure: CATARACT EXTRACTION PHACO AND INTRAOCULAR LENS PLACEMENT (IOC) RIGHT;  Surgeon: Galen Manila, MD;  Location: [facility];  Service: Ophthalmology;  Laterality: Right;  sleep apnea  8.51 00:48.6   CATARACT EXTRACTION W/PHACO Left 08/01/2021   Procedure: CATARACT EXTRACTION PHACO AND INTRAOCULAR LENS PLACEMENT (IOC) LEFT 8.06 01:02.5;  Surgeon: Galen Manila, MD;  Location: [facility];  Service: Ophthalmology;  Laterality: Left;  sleep apnea   COLONOSCOPY     COLONOSCOPY WITH PROPOFOL N/A 10/14/2017   Procedure: COLONOSCOPY WITH PROPOFOL;  Surgeon: Scot Jun, MD;  Location: [facility];  Service: Endoscopy;  Laterality: N/A;   JOINT REPLACEMENT Right 04/2007   KNEE   TONSILLECTOMY      Social History Social History   Tobacco Use   Smoking status: Former    Current packs/day: 0.00    Average  packs/day: 1 pack/day for 10.0 years (10.0 ttl pk-yrs)    Types: Cigarettes    Start date: 01/11/1976    Quit date: 01/10/1986    Years since quitting: 36.7   Smokeless tobacco: Never  Vaping Use   Vaping status: Never Used  Substance Use Topics   Alcohol use: Yes    Alcohol/week: 14.0 standard drinks of alcohol    Types: 14 Glasses of wine per week   Drug use: Never    Family History Family History  Problem Relation Age of Onset   Cancer Mother        colon    Allergies  Allergen Reactions   Hydrocodone Other (See Comments)    hallucination   Oxycodone Other (See Comments)    Hallucination      REVIEW OF SYSTEMS (Negative unless checked)  Constitutional: [] Weight loss  [] Fever  [] Chills Cardiac: [] Chest pain   [] Chest pressure   [] Palpitations   [] Shortness of breath when laying flat   [] Shortness of breath with exertion. Vascular:  [x] Pain in legs with walking   [] Pain in legs at rest  [] History of DVT   [] Phlebitis   [] Swelling in legs   [] Varicose veins   [] Non-healing ulcers Pulmonary:   [] Uses home oxygen   [] Productive cough   [] Hemoptysis   [] Wheeze  [] COPD   [] Asthma Neurologic:  [] Dizziness   [] Seizures   [] History of stroke   [] History of TIA  [] Aphasia   [] Vissual changes   [] Weakness or numbness in arm   [x] Weakness or numbness in leg Musculoskeletal:   [] Joint swelling   [x] Joint pain   [x] Low back pain Hematologic:  [] Easy bruising  [] Easy bleeding   [] Hypercoagulable state   [] Anemic Gastrointestinal:  [] Diarrhea   [] Vomiting  [x] Gastroesophageal reflux/heartburn   [] Difficulty swallowing. Genitourinary:  [] Chronic kidney disease   [] Difficult urination  [] Frequent urination   [] Blood in urine Skin:  [] Rashes   [] Ulcers  Psychological:  [] History of anxiety   []  History of major depression.  Physical Examination  Vitals:   10/18/22 1016  BP: 126/70  Pulse: 78  Resp: 18  Weight: 232 lb 3.2 oz (105.3 kg)  Height: 5\' 8"  (1.727 m)   Body mass index is  35.31 kg/m. Gen: WD/WN, NAD Head: [place]/AT, No temporalis wasting.  Ear/Nose/Throat: Hearing grossly intact, nares w/o erythema or drainage Eyes: PER, EOMI, sclera nonicteric.  Neck: Supple, no masses.  No bruit or JVD.  Pulmonary:  Good air movement, no audible wheezing, no use of accessory muscles.  Cardiac: RRR, normal S1, S2, no Murmurs. Vascular:  carotid bruit noted Vessel Right Left  Radial Palpable Palpable  Carotid  Palpable  Palpable  Subclav  Palpable Palpable  Gastrointestinal: soft, non-distended. No guarding/no peritoneal signs.  Musculoskeletal: M/S 5/5 throughout.  No visible deformity.  Neurologic: CN  2-12 intact. Pain and light touch intact in extremities.  Symmetrical.  Speech is fluent. Motor exam as listed above. Psychiatric: Judgment intact, Mood & affect appropriate for pt's clinical situation. Dermatologic: No rashes or ulcers noted.  No changes consistent with cellulitis.   CBC No results found for: "WBC", "HGB", "HCT", "MCV", "PLT"  BMET No results found for: "NA", "K", "CL", "CO2", "GLUCOSE", "BUN", "CREATININE", "CALCIUM", "GFRNONAA", "GFRAA" CrCl cannot be calculated (No successful lab value found.).  COAG No results found for: "INR", "PROTIME"  Radiology CT ANGIO NECK W OR WO CONTRAST  Result Date: 10/18/2022 CLINICAL DATA:  82 year old male with occluded left ICA on carotid Doppler ultrasound 10/01/2022. History of smoking, hypertension, hyperlipidemia. EXAM: CT ANGIOGRAPHY NECK TECHNIQUE: Multidetector CT imaging of the neck was performed using the standard protocol during bolus administration of intravenous contrast. Multiplanar CT image reconstructions and MIPs were obtained to evaluate the vascular anatomy. Carotid stenosis measurements (when applicable) are obtained utilizing NASCET criteria, using the distal internal carotid diameter as the denominator. RADIATION DOSE REDUCTION: This exam was performed according to the departmental dose-optimization  program which includes automated exposure control, adjustment of the mA and/or kV according to patient size and/or use of iterative reconstruction technique. CONTRAST:  75mL OMNIPAQUE IOHEXOL 350 MG/ML SOLN COMPARISON:  Carotid Doppler 10/01/2022. head CT the same day reported separately. FINDINGS: Skeleton: Bulky lower cervical spine disc and endplate degeneration, with evidence of superimposed Diffuse idiopathic skeletal hyperostosis (DISH). Upper thoracic flowing endplate osteophytes also resulting in interbody ankylosis. Mild degenerative anterolisthesis at the cervicothoracic junction with degenerative bilateral facet hypertrophy and facet ankylosis. No acute osseous abnormality identified. Upper chest: Negative, mild respiratory motion. Other neck: Mild larynx motion artifact. Negative visible thyroid, pharynx, parapharyngeal spaces, retropharyngeal space, sublingual space, submandibular spaces, masticator and parotid spaces. No cervical lymphadenopathy. Partially visible brain parenchyma, reported on head CT the same day separately. Aortic arch: Calcified aortic atherosclerosis.  3 vessel arch. Right carotid system: Mild brachiocephalic artery plaque without stenosis. Normal right CCA origin. Little right CCA plaque before the bifurcation. But at the right ICA origin there is bulky mostly soft plaque posteriorly (series 10, image 146) with estimated stenosis up to 60 % with respect to the distal vessel. Tortuous right ICA bulb there. The vessel remains patent to the skull base. Left carotid system: Mild left CCA origin plaque without stenosis. Occluded left ICA origin (series 10, image 199). Left ECA remains patent. No reconstituted enhancement in the neck or at the skull base. Vertebral arteries: Mild proximal right subclavian artery plaque without stenosis. Right vertebral artery origin soft plaque but only mild stenosis. Tortuous right V1 segment. Right vertebral artery is patent to the skull base with no  additional plaque or stenosis. Proximal left subclavian artery mostly calcified plaque without stenosis. Left vertebral artery origin is patent with adjacent soft plaque and mild to moderate origin stenosis (series 9, image 137). Tortuous left V1 segment. Mildly dominant left vertebral artery with mild additional V3 segment plaque, no stenosis to the skull base. Limited intracranial: Posterior circulation: Bilateral distal vertebral artery calcified plaque. Mild left V4 and mild to moderate non dominant right V4 stenosis (series 9, image 117). Patent vertebrobasilar junction. Visible basilar artery is patent. Both PICA origins are patent. Anterior circulation: Visible right ICA siphon is patent with moderate calcified plaque at the junction of the petrous and cavernous segments. Visible left ICA siphon is occluded. Anatomic variants: Mildly dominant left vertebral artery. Review of the MIP images confirms the above findings IMPRESSION:  1. 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 2. Right ICA origin and bulb plaque with up to 60% stenosis. 3. Mild to moderate bilateral Vertebral Artery and distal V4 segment stenoses. 4.  Aortic Atherosclerosis (ICD10-I70.0). 5. Spinal Diffuse idiopathic skeletal hyperostosis (DISH), with hyperostosis related ankylosis. Electronically Signed   By: Odessa Fleming M.D.   On: 10/18/2022 09:09   CT HEAD WO CONTRAST ( )  Result Date: 10/10/2022 CLINICAL DATA:  Eval occlusion Lt carotid artery. No hx of stroke. 75 ml Omni 350. CRE 0.9 EXAM: CT HEAD WITHOUT CONTRAST TECHNIQUE: Contiguous axial images were obtained from the base of the skull through the vertex without intravenous contrast. RADIATION DOSE REDUCTION: This exam was performed according to the departmental dose-optimization program which includes automated exposure control, adjustment of the mA and/or kV according to patient size and/or use of iterative reconstruction technique.  COMPARISON:  CT head 06/20/2020, MRI head 11/08/2020 FINDINGS: Brain: Trace patchy and confluent areas of decreased attenuation are noted throughout the deep and periventricular white matter of the cerebral hemispheres bilaterally, compatible with chronic microvascular ischemic disease. Cerebral ventricle sizes are concordant with the degree of cerebral volume loss. No evidence of large-territorial acute infarction. No parenchymal hemorrhage. No mass lesion. No extra-axial collection. No mass effect or midline shift. No hydrocephalus. Basilar cisterns are patent. Vascular: No hyperdense vessel. Atherosclerotic calcifications are present within the cavernous internal carotid and vertebral arteries. Skull: No acute fracture or focal lesion. Sinuses/Orbits: Paranasal sinuses and mastoid air cells are clear. Bilateral lens replacement. Otherwise the orbits are unremarkable. Other: None. IMPRESSION: 1. No acute intracranial abnormality. 2. Please see separately dictated CT angio neck 10/09/2022 Electronically Signed   By: Tish Frederickson M.D.   On: 10/10/2022 01:50   US Carotid Bilateral  Result Date: 10/01/2022 CLINICAL DATA:  Dizziness. History of hypertension and hyperlipidemia. Former smoker. EXAM: BILATERAL CAROTID DUPLEX ULTRASOUND TECHNIQUE: Wallace Cullens scale imaging, color Doppler and duplex ultrasound were performed of bilateral carotid and vertebral arteries in the neck. COMPARISON:  None Available. FINDINGS: Criteria: Quantification of carotid stenosis is based on velocity parameters that correlate the residual internal carotid diameter with NASCET-based stenosis levels, using the diameter of the distal internal carotid lumen as the denominator for stenosis measurement. The following velocity measurements were obtained: RIGHT ICA: 154/31 cm/sec CCA: 70/14 cm/sec SYSTOLIC ICA/CCA RATIO:  2.2 ECA: 199 cm/sec LEFT ICA: Occluded CCA: 64/5 cm/sec SYSTOLIC ICA/CCA RATIO: N/A ECA: 122 cm/sec RIGHT CAROTID ARTERY: There  is a moderate amount of eccentric echogenic plaque within the right carotid bulb (image 15), extending to involve the origin and proximal aspects of the right internal carotid artery (image 22), which results in elevated peak systolic velocities within the proximal aspect of the right internal carotid artery. Greatest acquired peak systolic velocity within the proximal right ICA measures 154 centimeters/second (image 24). RIGHT VERTEBRAL ARTERY:  Antegrade Flow LEFT CAROTID ARTERY: There is a minimal to moderate amount of eccentric echogenic plaque within left carotid bulb (images 45 and 46). The left internal carotid artery is occluded from its origin throughout its imaged course (images 54, 58 and 61). LEFT VERTEBRAL ARTERY:  Antegrade flow IMPRESSION: 1. Age-indeterminate though presumably chronic occlusion of the left internal carotid artery. 2. Moderate amount of right-sided atherosclerotic plaque results in elevated peak systolic velocities within the right internal carotid artery compatible with the 50-69% luminal narrowing range, though note, velocity measurements are unreliable in the setting of a contralateral occlusion. Further evaluation  with CTA could be performed as indicated. 3. Preserved antegrade flow within the bilateral vertebral arteries. These results will be called to the ordering clinician or representative by the Radiologist Assistant, and communication documented in the PACS or [employer]. Electronically Signed   By: Simonne Come M.D.   On: 10/01/2022 17:08     Assessment/Plan 1. Bilateral carotid artery stenosis Recommend:  Given the patient's asymptomatic subcritical stenosis no further invasive testing or surgery at this time.  CT angiogram dated 10/09/2022 was reviewed by me with the patient and family.  It shows occlusion of the left internal carotid artery.  There is 60% stenosis of the right internal carotid artery.  Continue antiplatelet therapy as prescribed Continue  management of CAD, HTN and Hyperlipidemia Healthy heart diet,  encouraged exercise at least 4 times per week Follow up in 12 months with duplex ultrasound and physical exam  - VAS US CAROTID; Future  2. Essential hypertension Continue antihypertensive medications as already ordered, these medications have been reviewed and there are no changes at this time.  3. Mixed hyperlipidemia Continue statin as ordered and reviewed, no changes at this time  4. Gastroesophageal reflux disease without esophagitis Continue PPI as already ordered, this medication has been reviewed and there are no changes at this time.  Avoidence of caffeine and alcohol  Moderate elevation of the head of the bed     Levora Dredge, MD  10/18/2022 10:25 AM

## 2022-10-18 NOTE — Therapy (Signed)
OUTPATIENT PHYSICAL THERAPY CERVICAL and BALANCE TREATMENT    Patient Name: Billy Berg MRN: [id number] DOB:Mar 27, 1940, 82 y.o., male Today's Date: 10/18/2022  END OF SESSION:  PT End of Session - 10/18/22 1613     Visit Number 22    Number of Visits 24    Date for PT Re-Evaluation 10/17/22    Progress Note Due on Visit 10    PT Start Time 1617    PT Stop Time 1657    PT Time Calculation (min) 40 min    Equipment Utilized During Treatment Gait belt    Activity Tolerance Patient tolerated treatment well;No increased pain    Behavior During Therapy WFL for tasks assessed/performed                    Past Medical History:  Diagnosis Date   Anxiety    Arthritis    GERD (gastroesophageal reflux disease)    History of kidney stones    Hyperlipidemia    Hypertension    Sleep apnea    Wears hearing aid in both ears    Past Surgical History:  Procedure Laterality Date   CATARACT EXTRACTION W/PHACO Right 07/18/2021   Procedure: CATARACT EXTRACTION PHACO AND INTRAOCULAR LENS PLACEMENT (IOC) RIGHT;  Surgeon: Galen Manila, MD;  Location: [facility];  Service: Ophthalmology;  Laterality: Right;  sleep apnea 8.51 00:48.6   CATARACT EXTRACTION W/PHACO Left 08/01/2021   Procedure: CATARACT EXTRACTION PHACO AND INTRAOCULAR LENS PLACEMENT (IOC) LEFT 8.06 01:02.5;  Surgeon: Galen Manila, MD;  Location: [facility];  Service: Ophthalmology;  Laterality: Left;  sleep apnea   COLONOSCOPY     COLONOSCOPY WITH PROPOFOL N/A 10/14/2017   Procedure: COLONOSCOPY WITH PROPOFOL;  Surgeon: Scot Jun, MD;  Location: [facility] END[facility];  Service: Endoscopy;  Laterality: N/A;   JOINT REPLACEMENT Right 04/2007   KNEE   TONSILLECTOMY     There are no problems to display for this patient.   PCP: Dr Redding Sites  REFERRING PROVIDER: Dr. Raeshaun Sites  REFERRING DIAG: Neck pain; Balance disorder  THERAPY DIAG:  Cervicalgia  Muscle weakness  (generalized)  Rationale for Evaluation and Treatment: Rehabilitation  ONSET DATE: Around 6 months ago (02/2022)  SUBJECTIVE:                                                                                                                                                                                                         SUBJECTIVE STATEMENT: Pt spouse reports doctor appointment went well. He is going to get carotids rechecked  in a year. Pt has another neurologist appointment on the 28th. Pt spouse reports they want pt to exercise. Hand dominance: Right  PERTINENT HISTORY:   Patient presents with referral to outpatient PT following appt with Dr. Graciela Husbands for annual exam- including some neck and back pain and imbalance.   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  COLONOSCOPY 12/03/2013  Four 1-5 mm adenomas, sigmoid diverticulosis, internal hemorrhoids; repeat in 11/2016.  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 Yes: NPRS scale: 3-4/10 Pain location: Left upper trap region Pain description: ache Aggravating factors: lifting/driving, turning my head Relieving factors: rest, meds, occasional use of heat  PRECAUTIONS: Fall  WEIGHT BEARING RESTRICTIONS: No  FALLS:  Has patient fallen in last 6 months? Yes. Number of falls 2  LIVING ENVIRONMENT: Lives with: lives with their spouse Lives in: House/apartment Stairs: Yes: Internal: 12 steps; can reach both Has following equipment at home: None  OCCUPATION: Retired Software engineer  PLOF: Independent  PATIENT GOALS: To have improved  flexibility in my neck and improve my balance  NEXT MD VISIT: none  OBJECTIVE:   DIAGNOSTIC FINDINGS:  None recent  PATIENT SURVEYS:  NDI 24% indicating mild disability FOTO 41  COGNITION: Overall cognitive status: Within functional limits for tasks assessed- does revert to wife at times to answer questions  SENSATION: WFL with all 4 extremities  POSTURE: rounded shoulders and forward head  PALPATION: (+) tenderness along left Upper trap region with taught bands identified   CERVICAL ROM:   Active ROM A/PROM (deg) eval  Flexion 45  Extension 68  Right lateral flexion 18*  Left lateral flexion 26*  Right rotation 75*  Left rotation 52*   (Blank rows = not tested)    UPPER EXTREMITY MMT:  MMT Right eval Left eval  Shoulder flexion 5 4  Shoulder extension NT 4  Shoulder abduction 5 4  Shoulder adduction 5 4  Shoulder internal rotation 5 4  Shoulder external rotation 5 4  Elbow flexion 5 4+  Elbow extension 5 4+   (Blank rows = not tested)  Strength R/L 4/4 Hip flexion 4/4 Hip external rotation 4/4 Hip internal rotation 4/4 Hip extension  4/4 Hip abduction 5/5 Hip adduction 4/4 Knee extension 5/5 Knee flexion NT- Ankle Plantarflexion 5/5 Ankle Dorsiflexion     CERVICAL SPECIAL TESTS:  Spurling's test: Negative   Gait - Wide base of support- decreased step length- waddle gait pattern approx 120 feet total.   FUNCTIONAL TESTS:  5 times sit to stand: 19.48 10 meter walk test: 15.10sec or 0.66 m/s Functional gait assessment: 15/30  TODAY'S TREATMENT:  DATE: 10/18/22  Gelt belt donned and CGA provided unless otherwise specified  TE: LE strengthening in [employer]:  Lvls 1-2 of the following: bilateral  Leg press 10x Heel raise on leg press 10x Hamstring curl 2x10 Knee extension 2x10   Matrix cable machine 7.5#  1x10, 12.5# 2x10-12, 17.5# 1x6 bliat UE Rates medium + Matrix cable machine shrugs 7.5# 2x10. Still compensates with biceps, better at correcting technique  Wall push-up 2x15 bilat UE  SciFit LVL 4 - 2 min FWD/2 min BCKWD - cuing for technique      PATIENT EDUCATION:  Education details: Pt educated throughout session about proper posture and technique with exercises. Improved exercise technique, movement at target joints, use of target muscles after min to mod verbal, visual, tactile cues.   Person educated: Patient Education method: Explanation, Demonstration, Tactile cues, and Verbal cues Education comprehension: verbalized understanding, returned demonstration, verbal cues required, tactile cues required, and needs further education  HOME EXERCISE PROGRAM: Access Code: [email or web address] URL: [email or web address] Date: 07/31/2022 Prepared by: Maureen Ralphs  Exercises - Seated Shoulder Rolls  - 1 x daily - 7 x weekly - 3 sets - 10 reps - Standing Shoulder Row with Anchored Resistance  - 3 x weekly - 3 sets - 10 reps - Supine Chin Tuck  - 1 x daily - 7 x weekly - 3 sets - 10 reps - Shoulder extension with resistance - Neutral  - 1 x daily - 3 x weekly - 3 sets - 10 reps     Access Code: [email or web address] URL: [email or web address] Date: 07/25/2022 Prepared by: Maureen Ralphs  Exercises - Seated Cervical Sidebending Stretch  - 2 x daily - 3 sets - 30 sec hold - Seated Cervical Rotation AROM  - 2 x daily - 3 sets - 10 reps  ASSESSMENT:  CLINICAL IMPRESSION: Introduced pt to [employer] today in order to promote increased activity and strengthening outside of PT. Pt responded well to these interventions without pain or significant fatigue. He will require further review future visits. The pt will continue to benefit from skilled physical therapy intervention to address impairments, improve QOL, and attain therapy goals.     OBJECTIVE IMPAIRMENTS:  Abnormal gait, decreased activity tolerance, decreased balance, decreased coordination, decreased endurance, decreased mobility, difficulty walking, decreased ROM, decreased strength, hypomobility, and pain.   ACTIVITY LIMITATIONS: carrying, lifting, bending, standing, squatting, stairs, reach over head, and hygiene/grooming  PARTICIPATION LIMITATIONS: cleaning, laundry, driving, shopping, community activity, and yard work  PERSONAL FACTORS: Age and 1-2 comorbidities: anxiety, HTN  are also affecting patient's functional outcome.   REHAB POTENTIAL: Good  CLINICAL DECISION MAKING: Evolving/moderate complexity  EVALUATION COMPLEXITY: Moderate   GOALS: Goals reviewed with patient? Yes  SHORT TERM GOALS: Target date: 09/05/2022  Pt will be independent with HEP in order to improve strength and decrease back pain in order to improve pain-free function at home and work.    Baseline: EVAL- Patient with no formal HEP in place; 6/24: Pt reports he does his exercises maybe every other day, he is not entirely sure; 10/11/22: "Not often," completing 2x/week. He is not performing entire HEP. Goal status: IN PROGRESS    LONG TERM GOALS: Target date: 10/17/2022  Pt will decrease worst neck pain as reported on NPRS by at least 2 points in order to demonstrate clinically significant reduction in neck pain Baseline: EVAL = 4/10; 6/24: 2-3/10; 10/11/22: Pt rates worst pain as 2/10 Goal status: MET  2.  Pt will demonstrate decrease in NDI by at  least 19% in order to demonstrate clinically significant reduction in disability related to neck injury/pain  Baseline: Eval= 24%; 09/05/22: 22%; 10/11/22: 7 Goal status: PARTIALLY MET  3.  Pt will improve FOTO to target score to display perceived improvements in ability to complete ADL's.  Baseline:  6/24: 58 (previous score 57); 10/11/2022: 59 Goal status: MET  4.  Pt will improve FGA by at least 4 points in order to demonstrate clinically significant improvement in  balance.   Baseline: 15/30; 6/24: 25/30  Goal status: MET  5.  Pt will decrease 5TSTS by at least 3 seconds in order to demonstrate clinically significant improvement in LE strength. Baseline: 19.48 sec;  6/24: 17.5 sec; 10/11/22: 16 sec hands-free  Goal status: MET  6.  Pt will increase by at least 0.13 m/s in order to demonstrate clinically significant improvement in community ambulation.   Baseline: EVAL= 0.66 m/s; 6/24: 1.0 m/s  Goal status: MET  7.  Patient will present with > 5 deg improved cervical Rotation with active motion for improved ability turn his head while walking or driving.  Baseline: EVAL- Cervical Rotation L/R= 52/75 deg; 09/03/22: Cervical Rotation L/R = 45/72; 8/1/2: L 50 deg, R 73 deg  Goal status: ONGOING  PLAN:  PT FREQUENCY: 1-2x/week  PT DURATION: 12 weeks  PLANNED INTERVENTIONS: Therapeutic exercises, Therapeutic activity, Neuromuscular re-education, Balance training, Gait training, Patient/Family education, Self Care, Joint mobilization, Joint manipulation, Stair training, Vestibular training, Canalith repositioning, DME instructions, Dry Needling, Electrical stimulation, Spinal manipulation, Spinal mobilization, Cryotherapy, Moist heat, Manual therapy, and Re-evaluation  PLAN FOR NEXT SESSION: Continue with manual therapy for pain relief and ROM of c-spine and balance training for impaired balance, UE strengthening within pain-free ranges. Continue plan  Baird Kay PT ,DPT Physical Therapist- [facility]   10/18/2022, 5:09 PM

## 2022-10-19 ENCOUNTER — Encounter (INDEPENDENT_AMBULATORY_CARE_PROVIDER_SITE_OTHER): Payer: Self-pay | Admitting: Vascular Surgery

## 2022-10-19 DIAGNOSIS — I10 Essential (primary) hypertension: Secondary | ICD-10-CM | POA: Insufficient documentation

## 2022-10-19 DIAGNOSIS — I6529 Occlusion and stenosis of unspecified carotid artery: Secondary | ICD-10-CM | POA: Insufficient documentation

## 2022-10-19 DIAGNOSIS — E785 Hyperlipidemia, unspecified: Secondary | ICD-10-CM | POA: Insufficient documentation

## 2022-10-19 DIAGNOSIS — K219 Gastro-esophageal reflux disease without esophagitis: Secondary | ICD-10-CM | POA: Insufficient documentation

## 2022-10-23 ENCOUNTER — Ambulatory Visit: Payer: Medicare Other

## 2022-10-29 ENCOUNTER — Ambulatory Visit: Payer: Medicare Other

## 2022-11-01 ENCOUNTER — Ambulatory Visit: Payer: Medicare Other

## 2022-11-06 ENCOUNTER — Ambulatory Visit: Payer: Medicare Other

## 2022-11-06 DIAGNOSIS — M542 Cervicalgia: Secondary | ICD-10-CM | POA: Diagnosis not present

## 2022-11-06 DIAGNOSIS — M5382 Other specified dorsopathies, cervical region: Secondary | ICD-10-CM

## 2022-11-06 DIAGNOSIS — M6281 Muscle weakness (generalized): Secondary | ICD-10-CM

## 2022-11-06 NOTE — Therapy (Signed)
OUTPATIENT PHYSICAL THERAPY CERVICAL and BALANCE TREATMENT/DISCHARGE NOTE    Patient Name: Billy Berg MRN: [id number] DOB:16-Apr-1940, 82 y.o., male Today's Date: 11/06/2022  END OF SESSION:  PT End of Session - 11/06/22 1151     Visit Number 23    Number of Visits 24    Date for PT Re-Evaluation 10/17/22    Progress Note Due on Visit 10    PT Start Time 1150    PT Stop Time 1222    PT Time Calculation (min) 32 min    Equipment Utilized During Treatment --    Activity Tolerance Patient tolerated treatment well;No increased pain    Behavior During Therapy WFL for tasks assessed/performed                    Past Medical History:  Diagnosis Date   Anxiety    Arthritis    GERD (gastroesophageal reflux disease)    History of kidney stones    Hyperlipidemia    Hypertension    Sleep apnea    Wears hearing aid in both ears    Past Surgical History:  Procedure Laterality Date   CATARACT EXTRACTION W/PHACO Right 07/18/2021   Procedure: CATARACT EXTRACTION PHACO AND INTRAOCULAR LENS PLACEMENT (IOC) RIGHT;  Surgeon: Galen Manila, MD;  Location: [facility];  Service: Ophthalmology;  Laterality: Right;  sleep apnea 8.51 00:48.6   CATARACT EXTRACTION W/PHACO Left 08/01/2021   Procedure: CATARACT EXTRACTION PHACO AND INTRAOCULAR LENS PLACEMENT (IOC) LEFT 8.06 01:02.5;  Surgeon: Galen Manila, MD;  Location: [facility];  Service: Ophthalmology;  Laterality: Left;  sleep apnea   COLONOSCOPY     COLONOSCOPY WITH PROPOFOL N/A 10/14/2017   Procedure: COLONOSCOPY WITH PROPOFOL;  Surgeon: Scot Jun, MD;  Location: [facility] ENDOSCOPY;  Service: Endoscopy;  Laterality: N/A;   JOINT REPLACEMENT Right 04/2007   KNEE   TONSILLECTOMY     Patient Active Problem List   Diagnosis Date Noted   Carotid stenosis 10/19/2022   Essential hypertension 10/19/2022   Hyperlipidemia 10/19/2022   GERD (gastroesophageal reflux disease) 10/19/2022    PCP: Dr Kairi Sites  REFERRING PROVIDER: Dr. Adonys Sites  REFERRING DIAG: Neck pain; Balance disorder  THERAPY DIAG:  Muscle weakness (generalized)  Limited active range of motion of cervical spine on rotation to right  Limited active range of motion (AROM) of cervical spine on lateral flexion to left  Rationale for Evaluation and Treatment: Rehabilitation  ONSET DATE: Around 6 months ago (02/2022)  SUBJECTIVE:  SUBJECTIVE STATEMENT: Pt ready to discharge today. No neck pain currently. Hand dominance: Right  PERTINENT HISTORY:   Patient presents with referral to outpatient PT following appt with Dr. Graciela Husbands for annual exam- including some neck and back pain and imbalance.   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  COLONOSCOPY 12/03/2013  Four 1-5 mm adenomas, sigmoid diverticulosis, internal hemorrhoids; repeat in 11/2016.  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 Yes: NPRS scale: 3-4/10 Pain location: Left upper trap region Pain description: ache Aggravating factors: lifting/driving, turning my head Relieving factors: rest, meds, occasional use of heat  PRECAUTIONS: Fall  WEIGHT BEARING RESTRICTIONS: No  FALLS:  Has patient fallen in last 6 months? Yes. Number of falls 2  LIVING ENVIRONMENT: Lives with: lives with their spouse Lives in: House/apartment Stairs: Yes: Internal: 12 steps; can reach  both Has following equipment at home: None  OCCUPATION: Retired Software engineer  PLOF: Independent  PATIENT GOALS: To have improved flexibility in my neck and improve my balance  NEXT MD VISIT: none  OBJECTIVE:   DIAGNOSTIC FINDINGS:  None recent  PATIENT SURVEYS:  NDI 24% indicating mild disability FOTO 7  COGNITION: Overall cognitive status: Within functional limits for tasks assessed- does revert to wife at times to answer questions  SENSATION: WFL with all 4 extremities  POSTURE: rounded shoulders and forward head  PALPATION: (+) tenderness along left Upper trap region with taught bands identified   CERVICAL ROM:   Active ROM A/PROM (deg) eval  Flexion 45  Extension 68  Right lateral flexion 18*  Left lateral flexion 26*  Right rotation 75*  Left rotation 52*   (Blank rows = not tested)    UPPER EXTREMITY MMT:  MMT Right eval Left eval  Shoulder flexion 5 4  Shoulder extension NT 4  Shoulder abduction 5 4  Shoulder adduction 5 4  Shoulder internal rotation 5 4  Shoulder external rotation 5 4  Elbow flexion 5 4+  Elbow extension 5 4+   (Blank rows = not tested)  Strength R/L 4/4 Hip flexion 4/4 Hip external rotation 4/4 Hip internal rotation 4/4 Hip extension  4/4 Hip abduction 5/5 Hip adduction 4/4 Knee extension 5/5 Knee flexion NT- Ankle Plantarflexion 5/5 Ankle Dorsiflexion     CERVICAL SPECIAL TESTS:  Spurling's test: Negative   Gait - Wide base of support- decreased step length- waddle gait pattern approx 120 feet total.   FUNCTIONAL TESTS:  5 times sit to stand: 19.48 10 meter walk test: 15.10sec or 0.66 m/s Functional gait assessment: 15/30  TODAY'S TREATMENT:  DATE: 11/06/22  Gelt belt donned and CGA provided unless otherwise specified  TE:   Cervical rotation  x 30 sec each  side  Cervical ext x 30 sec Cervical flexion x 30 sec  Cervical lateral flexion x 30 sec each side   Updated HEP and reviewed instructions to only perform to gentle-stretch range, never into painful ranges, and to discontinue should he experience pain and/or other symptoms.   Access Code: [email or web address] URL: [email or web address] Date: 11/06/2022 Prepared by: Temple Pacini  Exercises - Seated Cervical Sidebending Stretch  - 1 x daily - 7 x weekly - 1 sets - 1 reps - 30 seconds hold - Cervical Extension Stretch  - 1 x daily - 7 x weekly - 1 sets - 1 reps - 30 sec hold - Seated Cervical Flexion Stretch with Finger Support Behind Neck  - 1 x daily - 7 x weekly - 1 sets - 1 reps - 30 seconds hold - Seated Cervical Rotation Stretch  - 1 x daily - 7 x weekly - 1 sets - 1 reps - 30 seconds hold   LE strengthening in [employer]: Instruction throughout for technique and set-up with the following Lvls 1-2 of the following: bilateral  Leg press 10x Heel raise on leg press 10x Hamstring curl  10x Knee extension 10x Nustep Lvl 1-2 x 2 min  AROM of cervical rotation (see goal section below for details)  Reviewed discharge recommendations.      PATIENT EDUCATION:  Education details: Pt educated throughout session about proper posture and technique with exercises. Improved exercise technique, movement at target joints, use of target muscles after min to mod verbal, visual, tactile cues. Updated HEP, discharge recommendations  Person educated: Patient Education method: Explanation, Demonstration, Verbal cues, and Handouts Education comprehension: verbalized understanding and returned demonstration  HOME EXERCISE PROGRAM:   8/27: Access Code: [email or web address] URL: [email or web address] Date: 11/06/2022 Prepared by: Temple Pacini  Exercises - Seated Cervical Sidebending Stretch  - 1 x daily - 7 x weekly - 1 sets - 1 reps - 30 seconds hold - Cervical Extension Stretch  - 1 x  daily - 7 x weekly - 1 sets - 1 reps - 30 sec hold - Seated Cervical Flexion Stretch with Finger Support Behind Neck  - 1 x daily - 7 x weekly - 1 sets - 1 reps - 30 seconds hold - Seated Cervical Rotation Stretch  - 1 x daily - 7 x weekly - 1 sets - 1 reps - 30 seconds hold  Access Code: [email or web address] URL: [email or web address] Date: 07/31/2022 Prepared by: Maureen Ralphs  Exercises - Seated Shoulder Rolls  - 1 x daily - 7 x weekly - 3 sets - 10 reps - Standing Shoulder Row with Anchored Resistance  - 3 x weekly - 3 sets - 10 reps - Supine Chin Tuck  - 1 x daily - 7 x weekly - 3 sets - 10 reps - Shoulder extension with resistance - Neutral  - 1 x daily - 3 x weekly - 3 sets - 10 reps     Access Code: [email or web address] URL: [email or web address] Date: 07/25/2022 Prepared by: Maureen Ralphs  Exercises - Seated Cervical Sidebending Stretch  - 2 x daily - 3 sets - 30 sec hold - Seated Cervical Rotation AROM  - 2 x daily - 3 sets - 10 reps  ASSESSMENT:  CLINICAL IMPRESSION: Pt presents for final PT appointment. PT reviewed and updated HEP for discharge with recommendations (see above). PT also reviewed [employer] exercises and  pt able to demo correct technique during session. Pt plans to sign up for [employer] to continue gains beyond therapy. Cervical ROM goal not met, but pt reports no cervical pain at this time. All other LT PT goals previously met. The pt to discharge from therapy and does not require further PT at this time.    OBJECTIVE IMPAIRMENTS: Abnormal gait, decreased activity tolerance, decreased balance, decreased coordination, decreased endurance, decreased mobility, difficulty walking, decreased ROM, decreased strength, hypomobility, and pain.   ACTIVITY LIMITATIONS: carrying, lifting, bending, standing, squatting, stairs, reach over head, and hygiene/grooming  PARTICIPATION LIMITATIONS: cleaning, laundry, driving, shopping, community activity, and  yard work  PERSONAL FACTORS: Age and 1-2 comorbidities: anxiety, HTN  are also affecting patient's functional outcome.   REHAB POTENTIAL: Good  CLINICAL DECISION MAKING: Evolving/moderate complexity  EVALUATION COMPLEXITY: Moderate   GOALS: Goals reviewed with patient? Yes  SHORT TERM GOALS: Target date: 09/05/2022  Pt will be independent with HEP in order to improve strength and decrease back pain in order to improve pain-free function at home and work.    Baseline: EVAL- Patient with no formal HEP in place; 6/24: Pt reports he does his exercises maybe every other day, he is not entirely sure; 10/11/22: "Not often," completing 2x/week. He is not performing entire HEP. Goal status: IN PROGRESS    LONG TERM GOALS: Target date: 10/17/2022  Pt will decrease worst neck pain as reported on NPRS by at least 2 points in order to demonstrate clinically significant reduction in neck pain Baseline: EVAL = 4/10; 6/24: 2-3/10; 10/11/22: Pt rates worst pain as 2/10 Goal status: MET  2.  Pt will demonstrate decrease in NDI by at least 19% in order to demonstrate clinically significant reduction in disability related to neck injury/pain  Baseline: Eval= 24%; 09/05/22: 22%; 10/11/22: 7 Goal status: PARTIALLY MET  3.  Pt will improve FOTO to target score to display perceived improvements in ability to complete ADL's.  Baseline:  6/24: 58 (previous score 57); 10/11/2022: 59 Goal status: MET  4.  Pt will improve FGA by at least 4 points in order to demonstrate clinically significant improvement in balance.   Baseline: 15/30; 6/24: 25/30  Goal status: MET  5.  Pt will decrease 5TSTS by at least 3 seconds in order to demonstrate clinically significant improvement in LE strength. Baseline: 19.48 sec;  6/24: 17.5 sec; 10/11/22: 16 sec hands-free  Goal status: MET  6.  Pt will increase by at least 0.13 m/s in order to demonstrate clinically significant improvement in community ambulation.   Baseline:  EVAL= 0.66 m/s; 6/24: 1.0 m/s  Goal status: MET  7.  Patient will present with > 5 deg improved cervical Rotation with active motion for improved ability turn his head while walking or driving.  Baseline: EVAL- Cervical Rotation L/R= 52/75 deg; 09/03/22: Cervical Rotation L/R = 45/72; 8/1/2: L 50 deg, R 73 deg; 11/06/22: 65 R, 50 L and pain-free  Goal status: NOT MET  PLAN:  PT FREQUENCY: 1-2x/week  PT DURATION: 12 weeks  PLANNED INTERVENTIONS: Therapeutic exercises, Therapeutic activity, Neuromuscular re-education, Balance training, Gait training, Patient/Family education, Self Care, Joint mobilization, Joint manipulation, Stair training, Vestibular training, Canalith repositioning, DME instructions, Dry Needling, Electrical stimulation, Spinal manipulation, Spinal mobilization, Cryotherapy, Moist heat, Manual therapy, and Re-evaluation  PLAN FOR NEXT SESSION: d/c  Baird Kay PT ,DPT Physical Therapist- [facility]  [facility]   11/06/2022, 12:33 PM

## 2022-11-08 ENCOUNTER — Ambulatory Visit: Payer: Medicare Other

## 2022-11-15 ENCOUNTER — Ambulatory Visit: Payer: Medicare Other | Admitting: Physical Therapy

## 2022-11-21 ENCOUNTER — Ambulatory Visit: Payer: Medicare Other | Admitting: Physical Therapy

## 2022-11-26 ENCOUNTER — Ambulatory Visit: Payer: Medicare Other

## 2022-11-29 ENCOUNTER — Ambulatory Visit: Payer: Medicare Other | Admitting: Physical Therapy

## 2022-12-03 ENCOUNTER — Ambulatory Visit: Payer: Medicare Other

## 2022-12-06 ENCOUNTER — Ambulatory Visit: Payer: Medicare Other

## 2022-12-10 ENCOUNTER — Ambulatory Visit: Payer: Medicare Other

## 2022-12-13 ENCOUNTER — Ambulatory Visit: Payer: Medicare Other

## 2022-12-17 ENCOUNTER — Ambulatory Visit: Payer: Medicare Other

## 2022-12-19 ENCOUNTER — Ambulatory Visit: Payer: Medicare Other

## 2022-12-20 ENCOUNTER — Ambulatory Visit: Payer: Medicare Other

## 2022-12-24 ENCOUNTER — Ambulatory Visit: Payer: Medicare Other

## 2022-12-26 ENCOUNTER — Ambulatory Visit: Payer: Medicare Other

## 2022-12-31 ENCOUNTER — Ambulatory Visit: Payer: Medicare Other

## 2023-01-02 ENCOUNTER — Ambulatory Visit: Payer: Medicare Other | Admitting: Physical Therapy

## 2023-01-07 ENCOUNTER — Ambulatory Visit: Payer: Medicare Other

## 2023-01-09 ENCOUNTER — Ambulatory Visit: Payer: Medicare Other

## 2023-01-14 ENCOUNTER — Ambulatory Visit: Payer: Medicare Other

## 2023-01-15 ENCOUNTER — Other Ambulatory Visit: Payer: Self-pay | Admitting: Internal Medicine

## 2023-01-15 DIAGNOSIS — M542 Cervicalgia: Secondary | ICD-10-CM

## 2023-01-16 ENCOUNTER — Ambulatory Visit: Payer: Medicare Other

## 2023-01-21 ENCOUNTER — Ambulatory Visit: Payer: Medicare Other

## 2023-01-22 ENCOUNTER — Ambulatory Visit
Admission: RE | Admit: 2023-01-22 | Discharge: 2023-01-22 | Disposition: A | Discharge status: Home / Self Care | Payer: Medicare Other | Source: Ambulatory Visit | Attending: Internal Medicine | Admitting: Internal Medicine

## 2023-01-22 DIAGNOSIS — M542 Cervicalgia: Secondary | ICD-10-CM | POA: Diagnosis present

## 2023-01-23 ENCOUNTER — Ambulatory Visit: Payer: Medicare Other

## 2023-01-28 ENCOUNTER — Ambulatory Visit: Payer: Medicare Other

## 2023-01-30 ENCOUNTER — Ambulatory Visit: Payer: Medicare Other

## 2023-02-04 ENCOUNTER — Ambulatory Visit: Payer: Medicare Other

## 2023-02-06 ENCOUNTER — Ambulatory Visit: Payer: Medicare Other

## 2023-02-11 ENCOUNTER — Ambulatory Visit: Payer: Medicare Other

## 2023-02-13 ENCOUNTER — Ambulatory Visit: Payer: Medicare Other

## 2023-02-18 ENCOUNTER — Ambulatory Visit: Payer: Medicare Other

## 2023-02-19 ENCOUNTER — Telehealth: Payer: Self-pay | Admitting: Neurosurgery

## 2023-02-19 NOTE — Telephone Encounter (Signed)
MD.

## 2023-02-19 NOTE — Telephone Encounter (Signed)
MRI cervical PT at [facility] No injections  MD or PA?

## 2023-02-20 ENCOUNTER — Ambulatory Visit: Payer: Medicare Other

## 2023-02-20 NOTE — Telephone Encounter (Signed)
Billy Berg confirmed appt for 02/28/23.

## 2023-02-20 NOTE — Telephone Encounter (Signed)
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

## 2023-02-25 ENCOUNTER — Ambulatory Visit: Payer: Medicare Other

## 2023-02-26 NOTE — Progress Notes (Unsigned)
Referring Physician:  No referring provider defined for this encounter.  Primary Physician:  Billy Ferrier, MD  History of Present Illness: 02/28/2023 [name] is here today with a chief complaint of neck pain.  He has had some difficulty with neck pain over the past year.  Over the past few weeks, things have actually been better.  He has pain with turning his neck to the right.  Rest and medications have helped.  Heat has also helped.  He has no radicular pain.  He denies any issues with his balance.  He has had some issues with his memory over the past year.  Bowel/Bladder Dysfunction: none  Conservative measures:  Physical therapy: has participated in PT at [facility] rehab  Multimodal medical therapy including regular antiinflammatories: Tylenol  Injections: no epidural steroid injections  Past Surgery: none  Billy Berg has no symptoms of cervical myelopathy.  The symptoms are causing a significant impact on the patient's life.   I have utilized the care everywhere function in epic to review the outside records available from external health systems.  Review of Systems:  A 10 point review of systems is negative, except for the pertinent positives and negatives detailed in the HPI.  Past Medical History: Past Medical History:  Diagnosis Date   Anxiety    Arthritis    GERD (gastroesophageal reflux disease)    History of kidney stones    Hyperlipidemia    Hypertension    Sleep apnea    Wears hearing aid in both ears     Past Surgical History: Past Surgical History:  Procedure Laterality Date   CATARACT EXTRACTION W/PHACO Right 07/18/2021   Procedure: CATARACT EXTRACTION PHACO AND INTRAOCULAR LENS PLACEMENT (IOC) RIGHT;  Surgeon: Billy Manila, MD;  Location: [facility];  Service: Ophthalmology;  Laterality: Right;  sleep apnea 8.51 00:48.6   CATARACT EXTRACTION W/PHACO Left 08/01/2021   Procedure: CATARACT EXTRACTION PHACO AND INTRAOCULAR  LENS PLACEMENT (IOC) LEFT 8.06 01:02.5;  Surgeon: Billy Manila, MD;  Location: [facility];  Service: Ophthalmology;  Laterality: Left;  sleep apnea   COLONOSCOPY     COLONOSCOPY WITH PROPOFOL N/A 10/14/2017   Procedure: COLONOSCOPY WITH PROPOFOL;  Surgeon: Billy Jun, MD;  Location: [facility];  Service: Endoscopy;  Laterality: N/A;   JOINT REPLACEMENT Right 04/2007   KNEE   TONSILLECTOMY      Allergies: Allergies as of 02/28/2023 - Review Complete 02/28/2023  Allergen Reaction Noted   Hydrocodone Other (See Comments) 10/11/2017   Oxycodone Other (See Comments) 10/11/2017    Medications:  Current Outpatient Medications:    amLODipine (NORVASC) 10 MG tablet, Take 10 mg by mouth daily., Disp: , Rfl:    aspirin EC 81 MG tablet, Take 81 mg by mouth daily., Disp: , Rfl:    atorvastatin (LIPITOR) 20 MG tablet, Take 20 mg by mouth daily., Disp: , Rfl:    Bacillus Coagulans-Inulin (ALIGN PREBIOTIC-PROBIOTIC PO), Take by mouth daily., Disp: , Rfl:    carboxymethylcellulose (REFRESH PLUS) 0.5 % SOLN, 1 drop as needed., Disp: , Rfl:    donepezil (ARICEPT) 5 MG tablet, Take 5 mg by mouth at bedtime., Disp: , Rfl:    fexofenadine (ALLEGRA) 180 MG tablet, Take 180 mg by mouth daily., Disp: , Rfl:    losartan (COZAAR) 100 MG tablet, Take by mouth., Disp: , Rfl:    losartan-hydrochlorothiazide (HYZAAR) 100-12.5 MG tablet, Take 1 tablet by mouth daily., Disp: , Rfl:    Methylcellulose,  Laxative, (CITRUCEL PO), Take by mouth daily., Disp: , Rfl:    mometasone (NASONEX) 50 MCG/ACT nasal spray, Place into the nose., Disp: , Rfl:    Multiple Vitamin (MULTIVITAMIN) tablet, Take 1 tablet by mouth daily. Optimal-M, Nutrifii multivit,  1 at breakfast, 1 at dinner Optimal-V Nutrifii multivit, 2 at breakfast, 1 at dinner, Disp: , Rfl:    omega-3 acid ethyl esters (LOVAZA) 1 g capsule, Take 1 g by mouth 2 (two) times daily., Disp: , Rfl:    omeprazole (PRILOSEC) 20 MG capsule, Take 20 mg  by mouth daily., Disp: , Rfl:    Probiotic Product (ALIGN) 4 MG CAPS, Take by mouth., Disp: , Rfl:    sertraline (ZOLOFT) 50 MG tablet, Take 25 mg by mouth daily., Disp: , Rfl:    Turmeric 500 MG TABS, Take 2,000 mg by mouth daily., Disp: , Rfl:    amoxicillin (AMOXIL) 500 MG tablet, Take 2,000 mg by mouth. 1 hour prior to dental procedures (Patient not taking: Reported on 02/28/2023), Disp: , Rfl:   Social History: Social History   Tobacco Use   Smoking status: Former    Current packs/day: 0.00    Average packs/day: 1 pack/day for 10.0 years (10.0 ttl pk-yrs)    Types: Cigarettes    Start date: 01/11/1976    Quit date: 01/10/1986    Years since quitting: 37.1   Smokeless tobacco: Never  Vaping Use   Vaping status: Never Used  Substance Use Topics   Alcohol use: Yes    Alcohol/week: 14.0 standard drinks of alcohol    Types: 14 Glasses of wine per week   Drug use: Never    Family Medical History: Family History  Problem Relation Age of Onset   Cancer Mother        colon    Physical Examination: Vitals:   02/28/23 1310  BP: 126/72    General: Patient is in no apparent distress. Attention to examination is appropriate.  Neck:   Limited range of motion.  Respiratory: Patient is breathing without any difficulty.   NEUROLOGICAL:     Awake, alert, oriented to person, place, and time.  Speech is clear and fluent.   Cranial Nerves: Pupils equal round and reactive to light.  Facial tone is symmetric.  Facial sensation is symmetric. Shoulder shrug is symmetric. Tongue protrusion is midline.  There is no pronator drift.  Strength: Side Biceps Triceps Deltoid Interossei Grip Wrist Ext. Wrist Flex.  R 5 5 5 5 5 5 5   L 5 5 5 5 5 5 5    Side Iliopsoas Quads Hamstring PF DF EHL  R 5 5 5 5 5 5   L 5 5 5 5 5 5    Reflexes are 1+ and symmetric at the biceps, triceps, brachioradialis, patella and achilles.   Hoffman's is absent.   Bilateral upper and lower extremity sensation is  intact to light touch.    No evidence of dysmetria noted.  Gait is normal.     Medical Decision Making  Imaging: MRI C spine 01/22/2023 Disc levels:   Discs: Moderate disc height loss at C5-6, C6-7 and C7-T1.   C2-3: No disc protrusion. Severe left facet arthropathy. Severe left foraminal stenosis. No right foraminal stenosis. No central canal stenosis.   C3-4: Mild disc bulge. Severe left and mild right facet arthropathy. Bilateral uncovertebral degenerative changes. Severe left foraminal stenosis. Moderate-severe right foraminal stenosis. No central canal stenosis.   C4-5: Mild disc bulge. Severe left and mild right facet  arthropathy. Bilateral uncovertebral degenerative changes. Severe bilateral foraminal stenosis. Mild central canal stenosis.   C5-6: Mild disc bulge with a small central disc protrusion contacting the ventral cervical spinal cord. Moderate bilateral facet arthropathy. Bilateral uncovertebral degenerative changes. Severe bilateral foraminal stenosis. Moderate central canal stenosis.   C6-7: Mild disc bulge. Moderate bilateral facet arthropathy. Moderate-severe bilateral foraminal stenosis. Mild central canal stenosis.   C7-T1: Mild disc bulge. Moderate bilateral facet arthropathy. Mild bilateral foraminal stenosis.   IMPRESSION: 1. Diffuse cervical spine spondylosis as described above. 2. No acute osseous injury of the cervical spine.     Electronically Signed   By: Elige Ko M.D.   On: 02/12/2023 14:28  I have personally reviewed the images and agree with the above interpretation.  Assessment and Plan: Mr. Burkette is a pleasant 82 y.o. male with cervical spondylosis.  He had neck pain, but that has resolved.  He has no radicular or myelopathic symptoms.  He is taking occasional ibuprofen with Tylenol arthritis to help with the pain.  He is also started being more active with the senior center.  This is clearly helping him.  At this point, I  would not recommend any treatment.  If his symptoms recur, we will discuss the options.  I would be very hesitant to consider surgery given his cognitive decline over the past year.  I spent a total of 30 minutes in this patient's care today. This time was spent reviewing pertinent records including imaging studies, obtaining and confirming history, performing a directed evaluation, formulating and discussing my recommendations, and documenting the visit within the medical record.    Thank you for involving me in the care of this patient.      Ayo Guarino K. Myer Haff MD, [facility]

## 2023-02-27 ENCOUNTER — Ambulatory Visit: Payer: Medicare Other

## 2023-02-28 ENCOUNTER — Ambulatory Visit: Payer: Medicare Other | Admitting: Neurosurgery

## 2023-02-28 ENCOUNTER — Encounter: Payer: Self-pay | Admitting: Neurosurgery

## 2023-02-28 VITALS — BP 126/72 | Ht 68.0 in | Wt 229.0 lb

## 2023-02-28 DIAGNOSIS — M542 Cervicalgia: Secondary | ICD-10-CM

## 2023-02-28 DIAGNOSIS — M47892 Other spondylosis, cervical region: Secondary | ICD-10-CM

## 2023-03-04 ENCOUNTER — Ambulatory Visit: Payer: Medicare Other

## 2023-03-07 ENCOUNTER — Ambulatory Visit: Payer: Medicare Other

## 2023-03-11 ENCOUNTER — Ambulatory Visit: Payer: Medicare Other

## 2023-03-14 ENCOUNTER — Ambulatory Visit: Payer: Medicare Other

## 2023-05-24 ENCOUNTER — Encounter: Payer: Self-pay | Admitting: Gastroenterology

## 2023-06-13 ENCOUNTER — Encounter: Payer: Self-pay | Admitting: Gastroenterology

## 2023-06-17 ENCOUNTER — Encounter: Payer: Self-pay | Admitting: Gastroenterology

## 2023-06-24 ENCOUNTER — Ambulatory Visit
Admission: RE | Admit: 2023-06-24 | Discharge: 2023-06-24 | Disposition: A | Discharge status: Home / Self Care | Payer: Medicare Other | Attending: Gastroenterology | Admitting: Gastroenterology

## 2023-06-24 ENCOUNTER — Encounter
Admission: RE | Disposition: A | Discharge status: Home / Self Care | Payer: Self-pay | Source: Home / Self Care | Attending: Gastroenterology

## 2023-06-24 ENCOUNTER — Ambulatory Visit: Admitting: Anesthesiology

## 2023-06-24 ENCOUNTER — Encounter: Payer: Self-pay | Admitting: Gastroenterology

## 2023-06-24 ENCOUNTER — Other Ambulatory Visit: Payer: Self-pay

## 2023-06-24 DIAGNOSIS — K5730 Diverticulosis of large intestine without perforation or abscess without bleeding: Secondary | ICD-10-CM | POA: Insufficient documentation

## 2023-06-24 DIAGNOSIS — I10 Essential (primary) hypertension: Secondary | ICD-10-CM | POA: Diagnosis not present

## 2023-06-24 DIAGNOSIS — D120 Benign neoplasm of cecum: Secondary | ICD-10-CM | POA: Diagnosis not present

## 2023-06-24 DIAGNOSIS — D123 Benign neoplasm of transverse colon: Secondary | ICD-10-CM | POA: Insufficient documentation

## 2023-06-24 DIAGNOSIS — F419 Anxiety disorder, unspecified: Secondary | ICD-10-CM | POA: Diagnosis not present

## 2023-06-24 DIAGNOSIS — G4730 Sleep apnea, unspecified: Secondary | ICD-10-CM | POA: Insufficient documentation

## 2023-06-24 DIAGNOSIS — Z87891 Personal history of nicotine dependence: Secondary | ICD-10-CM | POA: Diagnosis not present

## 2023-06-24 DIAGNOSIS — K219 Gastro-esophageal reflux disease without esophagitis: Secondary | ICD-10-CM | POA: Insufficient documentation

## 2023-06-24 DIAGNOSIS — D696 Thrombocytopenia, unspecified: Secondary | ICD-10-CM | POA: Diagnosis not present

## 2023-06-24 DIAGNOSIS — Z800 Family history of malignant neoplasm of digestive organs: Secondary | ICD-10-CM | POA: Insufficient documentation

## 2023-06-24 DIAGNOSIS — Z1211 Encounter for screening for malignant neoplasm of colon: Secondary | ICD-10-CM | POA: Insufficient documentation

## 2023-06-24 HISTORY — PX: POLYPECTOMY: SHX149

## 2023-06-24 HISTORY — PX: COLONOSCOPY WITH PROPOFOL: SHX5780

## 2023-06-24 SURGERY — COLONOSCOPY WITH PROPOFOL
Anesthesia: General

## 2023-06-24 MED ORDER — LIDOCAINE HCL (PF) 2 % IJ SOLN
INTRAMUSCULAR | Status: AC
  Filled 2023-06-24: qty 5

## 2023-06-24 MED ORDER — PROPOFOL 10 MG/ML IV BOLUS
INTRAVENOUS | Status: AC
  Filled 2023-06-24: qty 20

## 2023-06-24 MED ORDER — PROPOFOL 10 MG/ML IV BOLUS
INTRAVENOUS | Status: DC | PRN
  Administered 2023-06-24: 40 mg via INTRAVENOUS

## 2023-06-24 MED ORDER — SODIUM CHLORIDE 0.9 % IV SOLN: INTRAVENOUS | Status: DC

## 2023-06-24 MED ORDER — LIDOCAINE HCL (CARDIAC) PF 100 MG/5ML IV SOSY
PREFILLED_SYRINGE | INTRAVENOUS | Status: DC | PRN
  Administered 2023-06-24: 80 mg via INTRAVENOUS

## 2023-06-24 MED ORDER — PROPOFOL 500 MG/50ML IV EMUL
INTRAVENOUS | Status: DC | PRN
  Administered 2023-06-24: 50 ug/kg/min via INTRAVENOUS

## 2023-06-24 NOTE — Interval H&P Note (Signed)
 History and Physical Interval Note: Preprocedure H&P from 06/24/23  was reviewed and there was no interval change after seeing and examining the patient.  Written consent was obtained from the patient after discussion of risks, benefits, and alternatives. Patient has consented to proceed with Colonoscopy with possible intervention   06/24/2023 1:42 PM  Billy Berg  has presented today for surgery, with the diagnosis of Z86.0100 (ICD-10-CM) - History of colon polyps Z80.0 (ICD-10-CM) - Family history of colon cancer.  The various methods of treatment have been discussed with the patient and family. After consideration of risks, benefits and other options for treatment, the patient has consented to  Procedure(s): COLONOSCOPY WITH PROPOFOL (N/A) as a surgical intervention.  The patient's history has been reviewed, patient examined, no change in status, stable for surgery.  I have reviewed the patient's chart and labs.  Questions were answered to the patient's satisfaction.     Quintin Buckle

## 2023-06-24 NOTE — H&P (Signed)
 Pre-Procedure H&P   Patient ID: Billy Berg is a 83 y.o. male.  Gastroenterology Provider: Jaynie Collins, DO  Referring Provider: Tawni Pummel, PA PCP: Lynnea Ferrier, MD  Date: 06/24/2023  HPI Billy Berg is a 83 y.o. male who presents today for Colonoscopy for Personal history of colon polyps, family history of colon cancer . Significant other present at bedside.  Mother with a history of colon cancer  Thrombocytopenia with platelets most recently 162,000 hemoglobin 13.6 MCV 99 creatinine 0.9  Last colonoscopy was in August 2019 with 3 adenomatous polyp, internal and external hemorrhoids.  2015 with 4 adenomatous polyp, diverticulosis and internal hemorrhoids.  2010 negative   Past Medical History:  Diagnosis Date   Anxiety    Arthritis    GERD (gastroesophageal reflux disease)    History of kidney stones    Hyperlipidemia    Hypertension    Sleep apnea    Wears hearing aid in both ears     Past Surgical History:  Procedure Laterality Date   CATARACT EXTRACTION W/PHACO Right 07/18/2021   Procedure: CATARACT EXTRACTION PHACO AND INTRAOCULAR LENS PLACEMENT (IOC) RIGHT;  Surgeon: Galen Manila, MD;  Location: [facility];  Service: Ophthalmology;  Laterality: Right;  sleep apnea 8.51 00:48.6   CATARACT EXTRACTION W/PHACO Left 08/01/2021   Procedure: CATARACT EXTRACTION PHACO AND INTRAOCULAR LENS PLACEMENT (IOC) LEFT 8.06 01:02.5;  Surgeon: Galen Manila, MD;  Location: [facility];  Service: Ophthalmology;  Laterality: Left;  sleep apnea   COLONOSCOPY     COLONOSCOPY WITH PROPOFOL N/A 10/14/2017   Procedure: COLONOSCOPY WITH PROPOFOL;  Surgeon: Scot Jun, MD;  Location: [facility] ENDOSCOPY;  Service: Endoscopy;  Laterality: N/A;   EYE SURGERY     JOINT REPLACEMENT Right 04/2007   KNEE   TONSILLECTOMY      Family History Mother- crc- No h/o GI disease or malignancy  Review of Systems  Constitutional:  Negative for  activity change, appetite change, chills, diaphoresis, fatigue, fever and unexpected weight change.  HENT:  Negative for trouble swallowing and voice change.   Respiratory:  Negative for shortness of breath and wheezing.   Cardiovascular:  Negative for chest pain, palpitations and leg swelling.  Gastrointestinal:  Negative for abdominal distention, abdominal pain, anal bleeding, blood in stool, constipation, diarrhea, nausea and vomiting.  Musculoskeletal:  Negative for arthralgias and myalgias.  Skin:  Negative for color change and pallor.  Neurological:  Negative for dizziness, syncope and weakness.  Psychiatric/Behavioral:  Negative for confusion. The patient is not nervous/anxious.   All other systems reviewed and are negative.    Medications No current facility-administered medications on file prior to encounter.   Current Outpatient Medications on File Prior to Encounter  Medication Sig Dispense Refill   amLODipine (NORVASC) 10 MG tablet Take 10 mg by mouth daily.     aspirin EC 81 MG tablet Take 81 mg by mouth daily.     donepezil (ARICEPT) 5 MG tablet Take 5 mg by mouth at bedtime.     losartan (COZAAR) 100 MG tablet Take by mouth.     Multiple Vitamin (MULTIVITAMIN) tablet Take 1 tablet by mouth daily. Optimal-M, Nutrifii multivit,  1 at breakfast, 1 at dinner Optimal-V Nutrifii multivit, 2 at breakfast, 1 at dinner     omega-3 acid ethyl esters (LOVAZA) 1 g capsule Take 1 g by mouth 2 (two) times daily.     sertraline (ZOLOFT) 50 MG tablet Take 25 mg by mouth daily.  Turmeric 500 MG TABS Take 2,000 mg by mouth daily.     amoxicillin (AMOXIL) 500 MG tablet Take 2,000 mg by mouth. 1 hour prior to dental procedures (Patient not taking: Reported on 02/28/2023)     atorvastatin (LIPITOR) 20 MG tablet Take 20 mg by mouth daily.     Bacillus Coagulans-Inulin (ALIGN PREBIOTIC-PROBIOTIC PO) Take by mouth daily.     carboxymethylcellulose (REFRESH PLUS) 0.5 % SOLN 1 drop as needed.      fexofenadine (ALLEGRA) 180 MG tablet Take 180 mg by mouth daily.     losartan-hydrochlorothiazide (HYZAAR) 100-12.5 MG tablet Take 1 tablet by mouth daily.     Methylcellulose, Laxative, (CITRUCEL PO) Take by mouth daily.     mometasone (NASONEX) 50 MCG/ACT nasal spray Place into the nose.     omeprazole (PRILOSEC) 20 MG capsule Take 20 mg by mouth daily.     Probiotic Product (ALIGN) 4 MG CAPS Take by mouth.      Pertinent medications related to GI and procedure were reviewed by me with the patient prior to the procedure   Current Facility-Administered Medications:    0.9 %  sodium chloride infusion, , Intravenous, Continuous, Jaynie Collins, DO, Last Rate: 20 mL/hr at 06/24/23 1303, New Bag at 06/24/23 1303  sodium chloride 20 mL/hr at 06/24/23 1303       Allergies  Allergen Reactions   Hydrocodone Other (See Comments)    hallucination   Oxycodone Other (See Comments)    Hallucination    Allergies were reviewed by me prior to the procedure  Objective   Body mass index is 35.31 kg/m. Vitals:   06/24/23 1249  BP: (!) 145/73  Pulse: 74  Resp: 16  Temp: (!) 96.7 F (35.9 C)  TempSrc: Tympanic  SpO2: 96%  Weight: 105.3 kg  Height: 5\' 8"  (1.727 m)     Physical Exam Vitals and nursing note reviewed.  Constitutional:      General: He is not in acute distress.    Appearance: Normal appearance. He is obese. He is not ill-appearing, toxic-appearing or diaphoretic.  HENT:     Head: Normocephalic and atraumatic.     Nose: Nose normal.     Mouth/Throat:     Mouth: Mucous membranes are moist.     Pharynx: Oropharynx is clear.  Eyes:     General: No scleral icterus.    Extraocular Movements: Extraocular movements intact.  Cardiovascular:     Rate and Rhythm: Normal rate and regular rhythm.     Heart sounds: Normal heart sounds. No murmur heard.    No friction rub. No gallop.  Pulmonary:     Effort: Pulmonary effort is normal. No respiratory distress.      Breath sounds: Normal breath sounds. No wheezing, rhonchi or rales.  Abdominal:     General: Bowel sounds are normal. There is no distension.     Palpations: Abdomen is soft.     Tenderness: There is no abdominal tenderness. There is no guarding or rebound.  Musculoskeletal:     Cervical back: Neck supple.     Right lower leg: No edema.     Left lower leg: No edema.  Skin:    General: Skin is warm and dry.     Coloration: Skin is not jaundiced or pale.  Neurological:     General: No focal deficit present.     Mental Status: He is alert and oriented to person, place, and time. Mental status is at baseline.  Psychiatric:        Mood and Affect: Mood normal.        Behavior: Behavior normal.        Thought Content: Thought content normal.        Judgment: Judgment normal.      Assessment:  [name] is a 83 y.o. male  who presents today for Colonoscopy for Personal history of colon polyps, family history of colon cancer .  Plan:  Colonoscopy with possible intervention today  Colonoscopy with possible biopsy, control of bleeding, polypectomy, and interventions as necessary has been discussed with the patient/patient representative. Informed consent was obtained from the patient/patient representative after explaining the indication, nature, and risks of the procedure including but not limited to death, bleeding, perforation, missed neoplasm/lesions, cardiorespiratory compromise, and reaction to medications. Opportunity for questions was given and appropriate answers were provided. Patient/patient representative has verbalized understanding is amenable to undergoing the procedure.   Billy Buckle, DO  [facility]  Portions of the record may have been created with voice recognition software. Occasional wrong-word or 'sound-a-like' substitutions may have occurred due to the inherent limitations of voice recognition software.  Read the chart carefully and  recognize, using context, where substitutions may have occurred.

## 2023-06-24 NOTE — Op Note (Signed)
 [facility] Patient Name: Billy Berg Procedure Date: 06/24/2023 1:33 PM MRN: [id number] Account #: [id number] Date of Birth: [birth year] Admit Type: Outpatient Age: 83 Room: [facility] ENDO ROOM 2 Gender: Male Note Status: Supervisor Override Instrument Name: Prentice Docker [id number] Procedure:             Colonoscopy Indications:           Screening in patient at increased risk: Family history                         of 1st-degree relative with colorectal cancer, High                         risk colon cancer surveillance: Personal history of                         colonic polyps Providers:             Jaynie Collins DO, DO Medicines:             Monitored Anesthesia Care Complications:         No immediate complications. Estimated blood loss:                         Minimal. Procedure:             Pre-Anesthesia Assessment:                        - Prior to the procedure, a History and Physical was                         performed, and patient medications and allergies were                         reviewed. The patient is competent. The risks and                         benefits of the procedure and the sedation options and                         risks were discussed with the patient. All questions                         were answered and informed consent was obtained.                         Patient identification and proposed procedure were                         verified by the physician, the nurse, the anesthetist                         and the technician in the endoscopy suite. Mental                         Status Examination: alert and oriented. Airway                         Examination: normal oropharyngeal airway and neck  mobility. Respiratory Examination: clear to                         auscultation. CV Examination: RRR, no murmurs, no S3                         or S4. Prophylactic Antibiotics: The patient  does not                         require prophylactic antibiotics. Prior                         Anticoagulants: The patient has taken no anticoagulant                         or antiplatelet agents. ASA Grade Assessment: III - A                         patient with severe systemic disease. After reviewing                         the risks and benefits, the patient was deemed in                         satisfactory condition to undergo the procedure. The                         anesthesia plan was to use monitored anesthesia care                         (MAC). Immediately prior to administration of                         medications, the patient was re-assessed for adequacy                         to receive sedatives. The heart rate, respiratory                         rate, oxygen saturations, blood pressure, adequacy of                         pulmonary ventilation, and response to care were                         monitored throughout the procedure. The physical                         status of the patient was re-assessed after the                         procedure.                        After obtaining informed consent, the colonoscope was                         passed under direct vision. Throughout the procedure,  the patient's blood pressure, pulse, and oxygen                         saturations were monitored continuously. The                         Colonoscope was introduced through the anus and                         advanced to the the cecum, identified by appendiceal                         orifice and ileocecal valve. The colonoscopy was                         performed without difficulty. The patient tolerated                         the procedure well. The quality of the bowel                         preparation was evaluated using the BBPS [facility] Bowel                         Preparation Scale) with scores of: Right Colon = 3                          (entire mucosa seen well with no residual staining,                         small fragments of stool or opaque liquid), Transverse                         Colon = 3 (entire mucosa seen well with no residual                         staining, small fragments of stool or opaque liquid)                         and Left Colon = 2 (minor amount of residual staining,                         small fragments of stool and/or opaque liquid, but                         mucosa seen well). The total BBPS score equals 8. The                         quality of the bowel preparation was excellent. The                         ileocecal valve, appendiceal orifice, and rectum were                         photographed. Findings:      The perianal and digital rectal examinations were normal. Pertinent       negatives include normal  sphincter tone.      Three sessile polyps were found in the transverse colon (2) and cecum       (2). The polyps were 1 to 2 mm in size. These polyps were removed with a       jumbo cold forceps. Resection and retrieval were complete. Estimated       blood loss was minimal.      Multiple small-mouthed diverticula were found in the sigmoid colon.       Estimated blood loss: none.      The exam was otherwise without abnormality on direct and retroflexion       views. Impression:            - Three 1 to 2 mm polyps in the transverse colon and                         in the cecum, removed with a jumbo cold forceps.                         Resected and retrieved.                        - Diverticulosis in the sigmoid colon.                        - The examination was otherwise normal on direct and                         retroflexion views. Recommendation:        - Patient has a contact number available for                         emergencies. The signs and symptoms of potential                         delayed complications were discussed with the patient.                          Return to normal activities tomorrow. Written                         discharge instructions were provided to the patient.                        - Discharge patient to home.                        - Resume previous diet.                        - Continue present medications.                        - Await pathology results.                        - No further surveillance colonoscopy indicated given                         advanced age.                        -  Return to referring physician as previously                         scheduled.                        - The findings and recommendations were discussed with                         the patient.                        - The findings and recommendations were discussed with                         the patient's family. Procedure Code(s):     --- Professional ---                        [phone], Colonoscopy, flexible; with biopsy, single or                         multiple Diagnosis Code(s):     --- Professional ---                        Z86.010, Personal history of colonic polyps                        D12.3, Benign neoplasm of transverse colon (hepatic                         flexure or splenic flexure)                        D12.0, Benign neoplasm of cecum                        K57.30, Diverticulosis of large intestine without                         perforation or abscess without bleeding CPT copyright 2022 American Medical Association. All rights reserved. The codes documented in this report are preliminary and upon coder review may  be revised to meet current compliance requirements. Attending Participation:      I personally performed the entire procedure. Polo Brisk, DO Quintin Buckle DO, DO 06/24/2023 2:22:35 PM This report has been signed electronically. Number of Addenda: 0 Note Initiated On: 06/24/2023 1:33 PM Scope Withdrawal Time: 0 hours 16 minutes 42 seconds  Total Procedure Duration: 0 hours 23 minutes  2 seconds  Estimated Blood Loss:  Estimated blood loss was minimal.      [facility]

## 2023-06-24 NOTE — Anesthesia Preprocedure Evaluation (Addendum)
 Anesthesia Evaluation  Patient identified by MRN, date of birth, ID band Patient awake    Reviewed: Allergy & Precautions, NPO status , Patient's Chart, lab work & pertinent test results  History of Anesthesia Complications Negative for: history of anesthetic complications  Airway Mallampati: IV   Neck ROM: Full    Dental  (+) Missing   Pulmonary sleep apnea and Continuous Positive Airway Pressure Ventilation , former smoker (quit 1987)   Pulmonary exam normal breath sounds clear to auscultation       Cardiovascular hypertension, Normal cardiovascular exam Rhythm:Regular Rate:Normal  Echo 03/03/20:  Normal Stress Echocardiogram  NORMAL RIGHT VENTRICULAR SYSTOLIC FUNCTION  MILD VALVULAR REGURGITATION  NO VALVULAR STENOSIS NOTED     Neuro/Psych  Headaches PSYCHIATRIC DISORDERS Anxiety        GI/Hepatic ,GERD  ,,  Endo/Other  Obesity  Renal/GU Renal disease (nephrolithiasis)     Musculoskeletal  (+) Arthritis ,    Abdominal   Peds  Hematology negative hematology ROS (+)   Anesthesia Other Findings   Reproductive/Obstetrics                             Anesthesia Physical Anesthesia Plan  ASA: 2  Anesthesia Plan: General   Post-op Pain Management:    Induction: Intravenous  PONV Risk Score and Plan: 2 and Propofol infusion, TIVA and Treatment may vary due to age or medical condition  Airway Management Planned: Natural Airway  Additional Equipment:   Intra-op Plan:   Post-operative Plan:   Informed Consent: I have reviewed the patients History and Physical, chart, labs and discussed the procedure including the risks, benefits and alternatives for the proposed anesthesia with the patient or authorized representative who has indicated his/her understanding and acceptance.       Plan Discussed with: CRNA  Anesthesia Plan Comments: (LMA/GETA backup discussed.  Patient  consented for risks of anesthesia including but not limited to:  - adverse reactions to medications - damage to eyes, teeth, lips or other oral mucosa - nerve damage due to positioning  - sore throat or hoarseness - damage to heart, brain, nerves, lungs, other parts of body or loss of life  Informed patient about role of CRNA in peri- and intra-operative care.  Patient voiced understanding.)        Anesthesia Quick Evaluation

## 2023-06-24 NOTE — Transfer of Care (Signed)
 Immediate Anesthesia Transfer of Care Note  Patient: Khalifa Knecht  Procedure(s) Performed: COLONOSCOPY WITH PROPOFOL POLYPECTOMY, INTESTINE  Patient Location: PACU  Anesthesia Type:General  Level of Consciousness: awake, alert , oriented, and patient cooperative  Airway & Oxygen Therapy: Patient Spontanous Breathing and Patient connected to nasal cannula oxygen  Post-op Assessment: Report given to RN, Post -op Vital signs reviewed and stable, and Patient moving all extremities X 4  Post vital signs: Reviewed and stable  Last Vitals:  Vitals Value Taken Time  BP 144/82 06/24/23 1423  Temp 36.1 C 06/24/23 1422  Pulse 64 06/24/23 1423  Resp 20 06/24/23 1423  SpO2 92 % 06/24/23 1423  Vitals shown include unfiled device data.  Last Pain:  Vitals:   06/24/23 1422  TempSrc:   PainSc: Asleep     Patent airway to PACU breathing spontaneously on RA. Pt awake, responsive and comfortable. VSS.     Complications: No notable events documented.

## 2023-06-25 ENCOUNTER — Encounter: Payer: Self-pay | Admitting: Gastroenterology

## 2023-06-25 LAB — SURGICAL PATHOLOGY

## 2023-06-25 NOTE — Anesthesia Postprocedure Evaluation (Signed)
 Anesthesia Post Note  Patient: Billy Berg  Procedure(s) Performed: COLONOSCOPY WITH PROPOFOL POLYPECTOMY, INTESTINE  Patient location during evaluation: PACU Anesthesia Type: General Level of consciousness: awake and alert Pain management: pain level controlled Vital Signs Assessment: post-procedure vital signs reviewed and stable Respiratory status: spontaneous breathing, nonlabored ventilation and respiratory function stable Cardiovascular status: blood pressure returned to baseline and stable Postop Assessment: no apparent nausea or vomiting Anesthetic complications: no   No notable events documented.   Last Vitals:  Vitals:   06/24/23 1432 06/24/23 1442  BP: (!) 137/59 103/64  Pulse: 66 (!) 57  Resp: 13 14  Temp:    SpO2: 94% 97%    Last Pain:  Vitals:   06/24/23 1442  TempSrc:   PainSc: 0-No pain                 Baltazar Bonier

## 2023-07-30 ENCOUNTER — Encounter (INDEPENDENT_AMBULATORY_CARE_PROVIDER_SITE_OTHER): Payer: Self-pay

## 2023-09-23 ENCOUNTER — Other Ambulatory Visit: Payer: Self-pay | Admitting: Internal Medicine

## 2023-09-23 DIAGNOSIS — G3184 Mild cognitive impairment, so stated: Secondary | ICD-10-CM

## 2023-09-28 ENCOUNTER — Ambulatory Visit
Admission: RE | Admit: 2023-09-28 | Discharge: 2023-09-28 | Disposition: A | Discharge status: Home / Self Care | Source: Ambulatory Visit | Attending: Internal Medicine | Admitting: Internal Medicine

## 2023-09-28 DIAGNOSIS — G3184 Mild cognitive impairment, so stated: Secondary | ICD-10-CM | POA: Insufficient documentation

## 2023-11-13 ENCOUNTER — Other Ambulatory Visit (INDEPENDENT_AMBULATORY_CARE_PROVIDER_SITE_OTHER): Payer: Self-pay | Admitting: Vascular Surgery

## 2023-11-13 DIAGNOSIS — I6523 Occlusion and stenosis of bilateral carotid arteries: Secondary | ICD-10-CM

## 2023-11-14 ENCOUNTER — Ambulatory Visit (INDEPENDENT_AMBULATORY_CARE_PROVIDER_SITE_OTHER): Payer: Medicare Other | Admitting: Vascular Surgery

## 2023-11-14 ENCOUNTER — Other Ambulatory Visit (INDEPENDENT_AMBULATORY_CARE_PROVIDER_SITE_OTHER): Payer: Medicare Other

## 2023-11-14 ENCOUNTER — Encounter (INDEPENDENT_AMBULATORY_CARE_PROVIDER_SITE_OTHER): Payer: Self-pay | Admitting: Vascular Surgery

## 2023-11-14 VITALS — BP 125/74 | HR 60 | Ht 68.0 in | Wt 231.0 lb

## 2023-11-14 DIAGNOSIS — E782 Mixed hyperlipidemia: Secondary | ICD-10-CM

## 2023-11-14 DIAGNOSIS — I10 Essential (primary) hypertension: Secondary | ICD-10-CM | POA: Diagnosis not present

## 2023-11-14 DIAGNOSIS — I6523 Occlusion and stenosis of bilateral carotid arteries: Secondary | ICD-10-CM

## 2023-11-14 DIAGNOSIS — I739 Peripheral vascular disease, unspecified: Secondary | ICD-10-CM | POA: Diagnosis not present

## 2023-11-14 DIAGNOSIS — K219 Gastro-esophageal reflux disease without esophagitis: Secondary | ICD-10-CM

## 2023-11-14 NOTE — Progress Notes (Signed)
 MRN : [id number]  Billy Berg is a 83 y.o. (09/16/1940) male who presents with chief complaint of check carotid arteries.  History of Present Illness:   The patient is seen for evaluation of carotid stenosis. The carotid stenosis was identified after a bruit was auscultated.  Subsequently, a CT angio was obtained dated October 09, 2022.   The patient denies amaurosis fugax. There is no recent history of TIA symptoms or focal motor deficits. There is no prior documented CVA.   There is no history of migraine headaches. There is no history of seizures.   The patient is taking enteric-coated aspirin 81 mg daily.   No recent shortening of the patient's walking distance or new symptoms consistent with claudication.  No history of rest pain symptoms. No new ulcers or wounds of the lower extremities have occurred.   There is no history of DVT, PE or superficial thrombophlebitis. No recent episodes of angina or shortness of breath documented.   CT angiogram dated 10/09/2022 was reviewed by me with the patient and family.  It shows occlusion of the left internal carotid artery.  There is 60% stenosis of the right internal carotid artery.  Duplex ultrasound of the carotid arteries obtained today demonstrates RICA 40 to 59% and LICA total occlusion (known)  No outpatient medications have been marked as taking for the 11/14/23 encounter (Appointment) with Jama, Cordella MATSU, MD.    Past Medical History:  Diagnosis Date   Anxiety    Arthritis    GERD (gastroesophageal reflux disease)    History of kidney stones    Hyperlipidemia    Hypertension    Sleep apnea    Wears hearing aid in both ears     Past Surgical History:  Procedure Laterality Date   CATARACT EXTRACTION W/PHACO Right 07/18/2021   Procedure: CATARACT EXTRACTION PHACO AND INTRAOCULAR LENS PLACEMENT (IOC) RIGHT;  Surgeon: Jaye Fallow, MD;  Location: [facility];  Service: Ophthalmology;   Laterality: Right;  sleep apnea 8.51 00:48.6   CATARACT EXTRACTION W/PHACO Left 08/01/2021   Procedure: CATARACT EXTRACTION PHACO AND INTRAOCULAR LENS PLACEMENT (IOC) LEFT 8.06 01:02.5;  Surgeon: Jaye Fallow, MD;  Location: [facility];  Service: Ophthalmology;  Laterality: Left;  sleep apnea   COLONOSCOPY     COLONOSCOPY WITH PROPOFOL  N/A 10/14/2017   Procedure: COLONOSCOPY WITH PROPOFOL ;  Surgeon: Viktoria Lamar DASEN, MD;  Location: [facility] ENDOSCOPY;  Service: Endoscopy;  Laterality: N/A;   COLONOSCOPY WITH PROPOFOL  N/A 06/24/2023   Procedure: COLONOSCOPY WITH PROPOFOL ;  Surgeon: Onita Elspeth Sharper, DO;  Location: [facility] ENDOSCOPY;  Service: Gastroenterology;  Laterality: N/A;   EYE SURGERY     JOINT REPLACEMENT Right 04/2007   KNEE   POLYPECTOMY  06/24/2023   Procedure: POLYPECTOMY, INTESTINE;  Surgeon: Onita Elspeth Sharper, DO;  Location: [facility] ENDOSCOPY;  Service: Gastroenterology;;   TONSILLECTOMY      Social History Social History   Tobacco Use   Smoking status: Former    Current packs/day: 0.00    Average packs/day: 1 pack/day for 10.0 years (10.0 ttl pk-yrs)    Types: Cigarettes    Start date: 01/11/1976    Quit date: 01/10/1986    Years since quitting: 37.8   Smokeless tobacco: Never  Vaping Use   Vaping status: Never Used  Substance Use Topics   Alcohol use: Yes    Alcohol/week: 14.0  standard drinks of alcohol    Types: 14 Glasses of wine per week   Drug use: Never    Family History Family History  Problem Relation Age of Onset   Cancer Mother        colon    Allergies  Allergen Reactions   Hydrocodone Other (See Comments)    hallucination   Oxycodone Other (See Comments)    Hallucination      REVIEW OF SYSTEMS (Negative unless checked)  Constitutional: [] Weight loss  [] Fever  [] Chills Cardiac: [] Chest pain   [] Chest pressure   [] Palpitations   [] Shortness of breath when laying flat   [] Shortness of breath with exertion. Vascular:  [x] Pain  in legs with walking   [] Pain in legs at rest  [] History of DVT   [] Phlebitis   [] Swelling in legs   [] Varicose veins   [] Non-healing ulcers Pulmonary:   [] Uses home oxygen   [] Productive cough   [] Hemoptysis   [] Wheeze  [] COPD   [] Asthma Neurologic:  [] Dizziness   [] Seizures   [] History of stroke   [] History of TIA  [] Aphasia   [] Vissual changes   [] Weakness or numbness in arm   [] Weakness or numbness in leg Musculoskeletal:   [] Joint swelling   [] Joint pain   [] Low back pain Hematologic:  [] Easy bruising  [] Easy bleeding   [] Hypercoagulable state   [] Anemic Gastrointestinal:  [] Diarrhea   [] Vomiting  [x] Gastroesophageal reflux/heartburn   [] Difficulty swallowing. Genitourinary:  [] Chronic kidney disease   [] Difficult urination  [] Frequent urination   [] Blood in urine Skin:  [] Rashes   [] Ulcers  Psychological:  [] History of anxiety   []  History of major depression.  Physical Examination  There were no vitals filed for this visit. There is no height or weight on file to calculate BMI. Gen: WD/WN, NAD Head: [place]/AT, No temporalis wasting.  Ear/Nose/Throat: Hearing grossly intact, nares w/o erythema or drainage Eyes: PER, EOMI, sclera nonicteric.  Neck: Supple, no masses.  No bruit or JVD.  Pulmonary:  Good air movement, no audible wheezing, no use of accessory muscles.  Cardiac: RRR, normal S1, S2, no Murmurs. Vascular:  carotid bruit noted Vessel Right Left  Radial Palpable Palpable  Carotid  Palpable  Palpable  Subclav  Palpable Palpable  Gastrointestinal: soft, non-distended. No guarding/no peritoneal signs.  Musculoskeletal: M/S 5/5 throughout.  No visible deformity.  Neurologic: CN 2-12 intact. Pain and light touch intact in extremities.  Symmetrical.  Speech is fluent. Motor exam as listed above. Psychiatric: Judgment intact, Mood & affect appropriate for pt's clinical situation. Dermatologic: No rashes or ulcers noted.  No changes consistent with cellulitis.   CBC No results  found for: WBC, HGB, HCT, MCV, PLT  BMET No results found for: NA, K, CL, CO2, GLUCOSE, BUN, CREATININE, CALCIUM, GFRNONAA, GFRAA CrCl cannot be calculated (No successful lab value found.).  COAG No results found for: INR, PROTIME  Radiology No results found.   Assessment/Plan 1. Bilateral carotid artery stenosis (Primary) Recommend:   Given the patient's asymptomatic subcritical stenosis no further invasive testing or surgery at this time.  Duplex ultrasound of the carotid arteries obtained today demonstrates RICA 40 to 59% and LICA total occlusion (known)   Previous CT angiogram dated 10/09/2022 was reviewed by me with the patient and family.  It shows occlusion of the left internal carotid artery.  There is 60% stenosis of the right internal carotid artery.   Continue antiplatelet therapy as prescribed Continue management of CAD, HTN and Hyperlipidemia Healthy heart diet,  encouraged exercise at least 4 times per week Follow up in 12 months with duplex ultrasound and physical exam.   - VAS US  CAROTID; Future  2. PAD (peripheral artery disease) (HCC)  Recommend:  The patient has evidence of atherosclerosis of the lower extremities with claudication.  The patient does not voice lifestyle limiting changes at this point in time.  Noninvasive studies do not suggest clinically significant change.  No invasive studies, angiography or surgery at this time The patient should continue walking and begin a more formal exercise program.  The patient should continue antiplatelet therapy and aggressive treatment of the lipid abnormalities  No changes in the patient's medications at this time  Continued surveillance is indicated as atherosclerosis is likely to progress with time.    The patient will continue follow up with noninvasive studies as ordered.  - VAS US  ABI WITH/WO TBI; Future  3. Essential hypertension Continue antihypertensive  medications as already ordered, these medications have been reviewed and there are no changes at this time.  4. Mixed hyperlipidemia Continue statin as ordered and reviewed, no changes at this time  5. Gastroesophageal reflux disease without esophagitis Continue PPI as already ordered, this medication has been reviewed and there are no changes at this time.  Avoidence of caffeine and alcohol  Moderate elevation of the head of the bed      Cordella Shawl, MD  11/14/2023 10:44 AM

## 2024-11-12 ENCOUNTER — Encounter (INDEPENDENT_AMBULATORY_CARE_PROVIDER_SITE_OTHER)

## 2024-11-12 ENCOUNTER — Ambulatory Visit (INDEPENDENT_AMBULATORY_CARE_PROVIDER_SITE_OTHER): Admitting: Vascular Surgery
